# Patient Record
Sex: Female | Born: 1940 | Hispanic: No | Marital: Married
Health system: Southern US, Community
[De-identification: ages and names within clinical notes are randomized; demographics above are authoritative.]

---

## 2012-02-27 DIAGNOSIS — M949 Disorder of cartilage, unspecified: Secondary | ICD-10-CM | POA: Diagnosis not present

## 2012-02-27 DIAGNOSIS — R82998 Other abnormal findings in urine: Secondary | ICD-10-CM | POA: Diagnosis not present

## 2012-02-27 DIAGNOSIS — Z0000 Encounter for general adult medical examination without abnormal findings: Secondary | ICD-10-CM | POA: Diagnosis not present

## 2012-03-04 DIAGNOSIS — M899 Disorder of bone, unspecified: Secondary | ICD-10-CM | POA: Diagnosis not present

## 2012-03-04 DIAGNOSIS — Z23 Encounter for immunization: Secondary | ICD-10-CM | POA: Diagnosis not present

## 2012-03-04 DIAGNOSIS — Z0000 Encounter for general adult medical examination without abnormal findings: Secondary | ICD-10-CM | POA: Diagnosis not present

## 2012-03-04 DIAGNOSIS — Z1212 Encounter for screening for malignant neoplasm of rectum: Secondary | ICD-10-CM | POA: Diagnosis not present

## 2012-03-04 DIAGNOSIS — M949 Disorder of cartilage, unspecified: Secondary | ICD-10-CM | POA: Diagnosis not present

## 2012-03-04 DIAGNOSIS — M1990 Unspecified osteoarthritis, unspecified site: Secondary | ICD-10-CM | POA: Diagnosis not present

## 2012-05-29 DIAGNOSIS — M949 Disorder of cartilage, unspecified: Secondary | ICD-10-CM | POA: Diagnosis not present

## 2012-05-29 DIAGNOSIS — M899 Disorder of bone, unspecified: Secondary | ICD-10-CM | POA: Diagnosis not present

## 2012-06-13 DIAGNOSIS — H353190 Nonexudative age-related macular degeneration, unspecified eye, stage unspecified: Secondary | ICD-10-CM | POA: Diagnosis not present

## 2012-06-13 DIAGNOSIS — H2510 Age-related nuclear cataract, unspecified eye: Secondary | ICD-10-CM | POA: Diagnosis not present

## 2012-11-25 DIAGNOSIS — H2510 Age-related nuclear cataract, unspecified eye: Secondary | ICD-10-CM | POA: Diagnosis not present

## 2012-11-25 DIAGNOSIS — H353190 Nonexudative age-related macular degeneration, unspecified eye, stage unspecified: Secondary | ICD-10-CM | POA: Diagnosis not present

## 2012-11-25 DIAGNOSIS — H43819 Vitreous degeneration, unspecified eye: Secondary | ICD-10-CM | POA: Diagnosis not present

## 2013-02-21 DIAGNOSIS — J029 Acute pharyngitis, unspecified: Secondary | ICD-10-CM | POA: Diagnosis not present

## 2013-02-21 DIAGNOSIS — IMO0002 Reserved for concepts with insufficient information to code with codable children: Secondary | ICD-10-CM | POA: Diagnosis not present

## 2013-02-21 DIAGNOSIS — R05 Cough: Secondary | ICD-10-CM | POA: Diagnosis not present

## 2013-02-21 DIAGNOSIS — R0982 Postnasal drip: Secondary | ICD-10-CM | POA: Diagnosis not present

## 2013-03-03 DIAGNOSIS — R82998 Other abnormal findings in urine: Secondary | ICD-10-CM | POA: Diagnosis not present

## 2013-03-03 DIAGNOSIS — M899 Disorder of bone, unspecified: Secondary | ICD-10-CM | POA: Diagnosis not present

## 2013-03-03 DIAGNOSIS — Z0000 Encounter for general adult medical examination without abnormal findings: Secondary | ICD-10-CM | POA: Diagnosis not present

## 2013-03-03 DIAGNOSIS — R809 Proteinuria, unspecified: Secondary | ICD-10-CM | POA: Diagnosis not present

## 2013-03-10 DIAGNOSIS — H269 Unspecified cataract: Secondary | ICD-10-CM | POA: Diagnosis not present

## 2013-03-10 DIAGNOSIS — T6191XA Toxic effect of unspecified seafood, accidental (unintentional), initial encounter: Secondary | ICD-10-CM | POA: Diagnosis not present

## 2013-03-10 DIAGNOSIS — M899 Disorder of bone, unspecified: Secondary | ICD-10-CM | POA: Diagnosis not present

## 2013-03-10 DIAGNOSIS — Z23 Encounter for immunization: Secondary | ICD-10-CM | POA: Diagnosis not present

## 2013-03-10 DIAGNOSIS — M1990 Unspecified osteoarthritis, unspecified site: Secondary | ICD-10-CM | POA: Diagnosis not present

## 2013-03-10 DIAGNOSIS — IMO0002 Reserved for concepts with insufficient information to code with codable children: Secondary | ICD-10-CM | POA: Diagnosis not present

## 2013-03-10 DIAGNOSIS — Z1331 Encounter for screening for depression: Secondary | ICD-10-CM | POA: Diagnosis not present

## 2013-03-10 DIAGNOSIS — Z0000 Encounter for general adult medical examination without abnormal findings: Secondary | ICD-10-CM | POA: Diagnosis not present

## 2014-02-25 DIAGNOSIS — Z23 Encounter for immunization: Secondary | ICD-10-CM | POA: Diagnosis not present

## 2014-03-06 DIAGNOSIS — Z0000 Encounter for general adult medical examination without abnormal findings: Secondary | ICD-10-CM | POA: Diagnosis not present

## 2014-03-06 DIAGNOSIS — R8299 Other abnormal findings in urine: Secondary | ICD-10-CM | POA: Diagnosis not present

## 2014-03-06 DIAGNOSIS — R8290 Unspecified abnormal findings in urine: Secondary | ICD-10-CM | POA: Diagnosis not present

## 2014-03-06 DIAGNOSIS — M8580 Other specified disorders of bone density and structure, unspecified site: Secondary | ICD-10-CM | POA: Diagnosis not present

## 2014-03-12 ENCOUNTER — Other Ambulatory Visit: Payer: Self-pay | Admitting: Internal Medicine

## 2014-03-12 DIAGNOSIS — M8580 Other specified disorders of bone density and structure, unspecified site: Secondary | ICD-10-CM | POA: Diagnosis not present

## 2014-03-12 DIAGNOSIS — Z008 Encounter for other general examination: Secondary | ICD-10-CM | POA: Diagnosis not present

## 2014-03-12 DIAGNOSIS — M1990 Unspecified osteoarthritis, unspecified site: Secondary | ICD-10-CM | POA: Diagnosis not present

## 2014-03-12 DIAGNOSIS — R319 Hematuria, unspecified: Secondary | ICD-10-CM

## 2014-03-12 DIAGNOSIS — Z6822 Body mass index (BMI) 22.0-22.9, adult: Secondary | ICD-10-CM | POA: Diagnosis not present

## 2014-03-12 DIAGNOSIS — Z91013 Allergy to seafood: Secondary | ICD-10-CM | POA: Diagnosis not present

## 2014-03-12 DIAGNOSIS — Z1212 Encounter for screening for malignant neoplasm of rectum: Secondary | ICD-10-CM | POA: Diagnosis not present

## 2014-03-12 DIAGNOSIS — H269 Unspecified cataract: Secondary | ICD-10-CM | POA: Diagnosis not present

## 2014-03-12 DIAGNOSIS — Z1231 Encounter for screening mammogram for malignant neoplasm of breast: Secondary | ICD-10-CM

## 2014-03-12 DIAGNOSIS — Z1389 Encounter for screening for other disorder: Secondary | ICD-10-CM | POA: Diagnosis not present

## 2014-05-12 ENCOUNTER — Ambulatory Visit: Payer: Self-pay

## 2014-07-07 DIAGNOSIS — M859 Disorder of bone density and structure, unspecified: Secondary | ICD-10-CM | POA: Diagnosis not present

## 2015-01-20 DIAGNOSIS — M5432 Sciatica, left side: Secondary | ICD-10-CM | POA: Diagnosis not present

## 2015-01-20 DIAGNOSIS — S335XXA Sprain of ligaments of lumbar spine, initial encounter: Secondary | ICD-10-CM | POA: Diagnosis not present

## 2015-01-25 DIAGNOSIS — S335XXD Sprain of ligaments of lumbar spine, subsequent encounter: Secondary | ICD-10-CM | POA: Diagnosis not present

## 2015-01-29 DIAGNOSIS — S335XXD Sprain of ligaments of lumbar spine, subsequent encounter: Secondary | ICD-10-CM | POA: Diagnosis not present

## 2015-02-02 DIAGNOSIS — S335XXD Sprain of ligaments of lumbar spine, subsequent encounter: Secondary | ICD-10-CM | POA: Diagnosis not present

## 2015-02-05 DIAGNOSIS — S335XXD Sprain of ligaments of lumbar spine, subsequent encounter: Secondary | ICD-10-CM | POA: Diagnosis not present

## 2015-02-09 DIAGNOSIS — S335XXD Sprain of ligaments of lumbar spine, subsequent encounter: Secondary | ICD-10-CM | POA: Diagnosis not present

## 2015-02-12 DIAGNOSIS — S335XXD Sprain of ligaments of lumbar spine, subsequent encounter: Secondary | ICD-10-CM | POA: Diagnosis not present

## 2015-02-17 DIAGNOSIS — S335XXD Sprain of ligaments of lumbar spine, subsequent encounter: Secondary | ICD-10-CM | POA: Diagnosis not present

## 2015-02-19 DIAGNOSIS — S335XXA Sprain of ligaments of lumbar spine, initial encounter: Secondary | ICD-10-CM | POA: Diagnosis not present

## 2015-02-19 DIAGNOSIS — M5432 Sciatica, left side: Secondary | ICD-10-CM | POA: Diagnosis not present

## 2015-03-12 DIAGNOSIS — M859 Disorder of bone density and structure, unspecified: Secondary | ICD-10-CM | POA: Diagnosis not present

## 2015-03-12 DIAGNOSIS — N390 Urinary tract infection, site not specified: Secondary | ICD-10-CM | POA: Diagnosis not present

## 2015-03-12 DIAGNOSIS — Z0000 Encounter for general adult medical examination without abnormal findings: Secondary | ICD-10-CM | POA: Diagnosis not present

## 2015-03-12 DIAGNOSIS — R8290 Unspecified abnormal findings in urine: Secondary | ICD-10-CM | POA: Diagnosis not present

## 2015-03-13 DIAGNOSIS — Z23 Encounter for immunization: Secondary | ICD-10-CM | POA: Diagnosis not present

## 2015-03-19 DIAGNOSIS — M419 Scoliosis, unspecified: Secondary | ICD-10-CM | POA: Diagnosis not present

## 2015-03-19 DIAGNOSIS — M1990 Unspecified osteoarthritis, unspecified site: Secondary | ICD-10-CM | POA: Diagnosis not present

## 2015-03-19 DIAGNOSIS — Z1389 Encounter for screening for other disorder: Secondary | ICD-10-CM | POA: Diagnosis not present

## 2015-03-19 DIAGNOSIS — M8580 Other specified disorders of bone density and structure, unspecified site: Secondary | ICD-10-CM | POA: Diagnosis not present

## 2015-03-19 DIAGNOSIS — Z91013 Allergy to seafood: Secondary | ICD-10-CM | POA: Diagnosis not present

## 2015-03-19 DIAGNOSIS — M545 Low back pain: Secondary | ICD-10-CM | POA: Diagnosis not present

## 2015-03-19 DIAGNOSIS — M5416 Radiculopathy, lumbar region: Secondary | ICD-10-CM | POA: Diagnosis not present

## 2015-03-19 DIAGNOSIS — R319 Hematuria, unspecified: Secondary | ICD-10-CM | POA: Diagnosis not present

## 2015-03-19 DIAGNOSIS — Z0000 Encounter for general adult medical examination without abnormal findings: Secondary | ICD-10-CM | POA: Diagnosis not present

## 2015-03-19 DIAGNOSIS — H269 Unspecified cataract: Secondary | ICD-10-CM | POA: Diagnosis not present

## 2015-03-24 ENCOUNTER — Other Ambulatory Visit: Payer: Self-pay | Admitting: Internal Medicine

## 2015-03-24 DIAGNOSIS — R319 Hematuria, unspecified: Secondary | ICD-10-CM

## 2015-03-25 ENCOUNTER — Other Ambulatory Visit: Payer: Self-pay

## 2015-03-25 DIAGNOSIS — Z1212 Encounter for screening for malignant neoplasm of rectum: Secondary | ICD-10-CM | POA: Diagnosis not present

## 2015-03-25 DIAGNOSIS — Z1231 Encounter for screening mammogram for malignant neoplasm of breast: Secondary | ICD-10-CM

## 2015-03-26 ENCOUNTER — Other Ambulatory Visit: Payer: Self-pay

## 2015-03-27 ENCOUNTER — Encounter (HOSPITAL_COMMUNITY): Payer: Self-pay | Admitting: Emergency Medicine

## 2015-03-27 ENCOUNTER — Emergency Department (HOSPITAL_COMMUNITY): Payer: Medicare Other

## 2015-03-27 ENCOUNTER — Emergency Department (HOSPITAL_COMMUNITY)
Admission: EM | Admit: 2015-03-27 | Discharge: 2015-03-27 | Disposition: A | Discharge status: Home / Self Care | Payer: Medicare Other | Attending: Emergency Medicine | Admitting: Emergency Medicine

## 2015-03-27 DIAGNOSIS — S0990XA Unspecified injury of head, initial encounter: Secondary | ICD-10-CM | POA: Diagnosis present

## 2015-03-27 DIAGNOSIS — S80212A Abrasion, left knee, initial encounter: Secondary | ICD-10-CM | POA: Insufficient documentation

## 2015-03-27 DIAGNOSIS — W01198A Fall on same level from slipping, tripping and stumbling with subsequent striking against other object, initial encounter: Secondary | ICD-10-CM | POA: Diagnosis not present

## 2015-03-27 DIAGNOSIS — S60511A Abrasion of right hand, initial encounter: Secondary | ICD-10-CM | POA: Insufficient documentation

## 2015-03-27 DIAGNOSIS — Y998 Other external cause status: Secondary | ICD-10-CM | POA: Diagnosis not present

## 2015-03-27 DIAGNOSIS — S0003XA Contusion of scalp, initial encounter: Secondary | ICD-10-CM | POA: Diagnosis not present

## 2015-03-27 DIAGNOSIS — Y92093 Driveway of other non-institutional residence as the place of occurrence of the external cause: Secondary | ICD-10-CM | POA: Diagnosis not present

## 2015-03-27 DIAGNOSIS — S0181XA Laceration without foreign body of other part of head, initial encounter: Secondary | ICD-10-CM | POA: Insufficient documentation

## 2015-03-27 DIAGNOSIS — Y9301 Activity, walking, marching and hiking: Secondary | ICD-10-CM | POA: Insufficient documentation

## 2015-03-27 DIAGNOSIS — S80211A Abrasion, right knee, initial encounter: Secondary | ICD-10-CM | POA: Insufficient documentation

## 2015-03-27 DIAGNOSIS — W19XXXA Unspecified fall, initial encounter: Secondary | ICD-10-CM

## 2015-03-27 DIAGNOSIS — M25562 Pain in left knee: Secondary | ICD-10-CM | POA: Diagnosis not present

## 2015-03-27 DIAGNOSIS — M25462 Effusion, left knee: Secondary | ICD-10-CM | POA: Diagnosis not present

## 2015-03-27 DIAGNOSIS — S0191XA Laceration without foreign body of unspecified part of head, initial encounter: Secondary | ICD-10-CM

## 2015-03-27 MED ORDER — IBUPROFEN 800 MG PO TABS: 800.0000 mg | ORAL_TABLET | Freq: Three times a day (TID) | ORAL | Status: AC

## 2015-03-27 MED ORDER — IBUPROFEN 800 MG PO TABS
800.0000 mg | ORAL_TABLET | Freq: Once | ORAL | Status: AC
  Administered 2015-03-27: 800 mg via ORAL
  Filled 2015-03-27: qty 1

## 2015-03-27 NOTE — ED Provider Notes (Signed)
CSN: [id number]     Arrival date & time 03/27/15  1350 History   First MD Initiated Contact with Patient 03/27/15 1504     Chief Complaint  Patient presents with  . Head Injury  . Fall  . Knee Injury     (Consider location/radiation/quality/duration/timing/severity/associated sxs/prior Treatment) HPI   Karla West is a 74 y.o. female, patient with no pertinent past medical history, presenting to the ED with all. Patient states that she was walking on the concrete driveway, tripped over the dog, hit her head and landed on her hands and knees. Patient complains of some soreness to her forehead around a small laceration as well as some throbbing, 4 out of 5, nonradiating pain to her left knee. Patient denies LOC, neurologic deficits, confusion, nausea or vomiting, headache, visual disturbance, or any other complaints. Patient also denies neck or back pain and denies anticoagulation. Patient's daughter was immediately at the patient's side and verified the patient's story.    History reviewed. No pertinent past medical history. History reviewed. No pertinent past surgical history. History reviewed. No pertinent family history. Social History  Substance Use Topics  . Smoking status: None  . Smokeless tobacco: None  . Alcohol Use: None   OB History    No data available     Review of Systems  Musculoskeletal: Negative for back pain and neck pain.       Left knee pain, pain from multiple abrasions on patient's forehead, nose, and right and left knees.  Neurological: Negative for dizziness, syncope, weakness, numbness and headaches.  All other systems reviewed and are negative.     Allergies  Shellfish-derived products  Home Medications   Prior to Admission medications   Medication Sig Start Date End Date Taking? Authorizing Provider  ibuprofen (ADVIL,MOTRIN) 800 MG tablet Take 1 tablet (800 mg total) by mouth 3 (three) times daily. 03/27/15   Constantino Starace C Sharol Croghan, PA-C   BP 122/84  mmHg  Pulse 84  Temp(Src) 97.9 F (36.6 C) (Oral)  Resp 18  SpO2 98% Physical Exam  Constitutional: She is oriented to person, place, and time. She appears well-developed and well-nourished. No distress.  HENT:  Head: Normocephalic and atraumatic.  Right Ear: External ear normal.  Left Ear: External ear normal.  Mouth/Throat: Oropharynx is clear and moist.  A 1 cm laceration present on patient's forehead. Wound edges are well approximated. Patient has surrounding abrasions on her forehead as well as on the bridge of her nose. No raccoon's eyes, maxillary bruising, or any signs of basilar skull fracture. All structures to patient's head and face are stable. No epistaxis and ear canals are clear from any bleeding.  Eyes: Conjunctivae and EOM are normal. Pupils are equal, round, and reactive to light.  Neck: Normal range of motion. Neck supple.  Cardiovascular: Normal rate, regular rhythm and normal heart sounds.   Pulmonary/Chest: Effort normal and breath sounds normal. No respiratory distress.  Abdominal: Soft. Bowel sounds are normal.  Musculoskeletal: She exhibits no edema or tenderness.  Full range of motion without pain in all 4 extremities as well as patient's spine. No paraspinal tenderness. No laxity in the left knee.   Neurological: She is alert and oriented to person, place, and time. She has normal reflexes.  No sensory deficits. Strength 5/5 in all extremities. No gait disturbance. Cranial nerves II-XII grossly intact.  Skin: Skin is warm and dry. She is not diaphoretic.  Minor abrasions noted to patient's knees and right palm.  Nursing  note and vitals reviewed.   ED Course  Procedures (including critical care time) Labs Review Labs Reviewed - No data to display  Imaging Review Ct Head Wo Contrast  03/27/2015  CLINICAL DATA:  Head trauma secondary to a fall in his driveway. Laceration to the forehead. EXAM: CT HEAD WITHOUT CONTRAST TECHNIQUE: Contiguous axial images were  obtained from the base of the skull through the vertex without intravenous contrast. COMPARISON:  None. FINDINGS: No mass lesion. No midline shift. No acute hemorrhage or hematoma. No extra-axial fluid collections. No evidence of acute infarction. Brain parenchyma is normal. No osseous abnormality. Right frontal scalp contusion. IMPRESSION: Right frontal scalp contusion.  Otherwise, normal exam. Electronically Signed   By: Francene BoyersJames  Maxwell M.D.   On: 03/27/2015 16:20   Dg Knee Complete 4 Views Left  03/27/2015  CLINICAL DATA:  Initial encounter for Dog tripped patient on concrete driveway. Injured left knee with superficial abrasion, swelling, bruising. Denies previous history of left knee injury. EXAM: LEFT KNEE - COMPLETE 4+ VIEW COMPARISON:  None. FINDINGS: No acute fracture or dislocation. Small suprapatellar joint effusion. Mild medial and patellofemoral compartment joint space narrowing and subchondral sclerosis. IMPRESSION: Small suprapatellar joint effusion.  Otherwise, no acute finding. Electronically Signed   By: Jeronimo GreavesKyle  Talbot M.D.   On: 03/27/2015 14:49   I have personally reviewed and evaluated these images and lab results as part of my medical decision-making.   EKG Interpretation None      MDM   Final diagnoses:  Fall, initial encounter  Laceration of head, initial encounter  Left knee pain    Karla West presents with injuries from a fall including a forehead laceration and left knee pain.  Findings and plan of care discussed with Jerelyn ScottMartha Linker, MD.  Patient has no focal neurologic deficits, no loss of consciousness, and no signs of severe head injury. The only criteria the patient fits for head CT is her age. Plan to CT patient's head, apply knee immobilizer, give patient a prescription for anti-inflammatories, and instruct patient follow up with her orthopedist. Patient is already established patient [facility] and has an appointment coming up on December 2,  but patient was instructed to call their office and ask if they want to see her sooner. Plan of care was communicated with the patient, who agreed to the plan. Pt daughter at the bedside agreed to watch over patient for the next 24 hours to look for signs of intracranial hemorrhage or other severe head injury. Pt and pt daughter were given a list of signs and symptoms to watch out for. CT of the head was free from evidence of fracture or intracranial hemorrhage.    Anselm PancoastShawn C Kaius Daino, PA-C 03/27/15 1637  Jerelyn ScottMartha Linker, MD 03/27/15 1640  Jerelyn ScottMartha Linker, MD 04/02/15 [phone]

## 2015-03-27 NOTE — ED Notes (Signed)
Dog tripped patient on concrete driveway. Hit head, has laceration to forehead. Injured left knee with superficial abrasion, swelling, bruising. Denies being on blood thinners or neck pain.

## 2015-03-27 NOTE — Discharge Instructions (Signed)
You have been seen today for injuries from a fall. Your head CT showed no abnormalities. It is recommended that you follow-up [facility] on Monday. Follow up with PCP as needed. Return to ED should symptoms worsen. May use ibuprofen, Aleve, Motrin, or Advil for pain and inflammation. Refer to the attached information sheets for signs and symptoms to watch out for to return to the ED.   Emergency Department Resource Guide 1) Find a Doctor and Pay Out of Pocket Although you won't have to find out who is covered by your insurance plan, it is a good idea to ask around and get recommendations. You will then need to call the office and see if the doctor you have chosen will accept you as a new patient and what types of options they offer for patients who are self-pay. Some doctors offer discounts or will set up payment plans for their patients who do not have insurance, but you will need to ask so you aren't surprised when you get to your appointment.  2) Contact Your Local Health Department Not all health departments have doctors that can see patients for sick visits, but many do, so it is worth a call to see if yours does. If you don't know where your local health department is, you can check in your phone book. The CDC also has a tool to help you locate your state's health department, and many state websites also have listings of all of their local health departments.  3) Find a Walk-in Clinic If your illness is not likely to be very severe or complicated, you may want to try a walk in clinic. These are popping up all over the country in pharmacies, drugstores, and shopping centers. They're usually staffed by nurse practitioners or physician assistants that have been trained to treat common illnesses and complaints. They're usually fairly quick and inexpensive. However, if you have serious medical issues or chronic medical problems, these are probably not your best option.  No Primary Care  Doctor: - Call Health Connect at  [phone] - they can help you locate a primary care doctor that  accepts your insurance, provides certain services, etc. - Physician Referral Service- [phone]  Chronic Pain Problems: Organization         Address  Phone   Notes  [facility]  [phone] Patients need to be referred by their primary care doctor.   Medication Assistance: Organization         Address  Phone   Notes  [facility] [address] [place], [place] [address] [phone] --Must be a resident of [facility] [place] -- Must have NO insurance coverage whatsoever (no Medicaid/ Medicare, etc.) -- The pt. MUST have a primary care doctor that directs their care regularly and follows them in the community   MedAssist  [phone]   [employer]  [phone]    Agencies that provide inexpensive medical care: Organization         Address  Phone   Notes  [facility]  [phone]   [facility]    [phone]   [facility] [address] [place], [place] [address] [phone]   [facility] [address], [place] [phone]   [facility]anned [facility]    [phone]   [facility]    [phone]   [facility]  [address], [place] Phone:  [phone], Fax:  [phone] Hours of Operation:  9 am - 6 pm, M-F.  Also accepts Medicaid/Medicare and self-pay.  [facility] for [place] [address], [address], [place] Phone: [phone], Fax: [phone]. Hours of Operation:  8:30 am - 5:30 pm, M-F.  Also accepts Medicaid and self-pay.  [facility] [address], [place] Phone: [phone]   [place], [place], [place] [phone], Ext. [phone] Mondays & Thursdays: 7-9 AM.  First 15 patients are seen on a first  come, first serve basis.    [place] Providers:  Organization         Address  Phone   Notes  [facility] [address], [place] [phone] Also accepts self-pay patients.  [facility] [address], [place]  [phone]   [place], [address], [place] [phone]   [facility] [address], [place] [phone]   Lucianne Lei [address], [place]   [phone] Only accepts [place] Access [place] patients after they have their name applied to their card.   Self-Pay (no insurance) in [facility]:  Organization         Address  Phone   Notes  Sickle Cell Patients, [facility] [place] [phone]   [facility] [place] [phone]   [facility] [place]  [place], [place],  [phone]   [facility]/Dr. Osei-Bonsu  [address] [place] or [address], [place] [phone] Phone number for both [place] and [place] locations is the same.  Urgent Medical and [facility] [address], [place] [phone]   [facility] [address] [place] or [address] [phone]   [facility] [address] [place] [phone], phone; [phone], fax Sees patients 1st and 3rd Saturday of every month.  Must not qualify for public or private insurance (i.e. Medicaid, Medicare, [place] Health Choice, Veterans' Benefits)  Household income should be no more than 200% of the poverty level The clinic cannot treat you if you are pregnant or think you are pregnant  Sexually transmitted diseases are not treated at the clinic.    Dental Care: Organization          Address  Phone  Notes  [facility] Department of [facility] [place] [phone] Accepts children up to age 5 who are enrolled in [place] or [place]; pregnant women with a Medicaid card; and children who have applied for Medicaid or [place] Health Choice, but were declined, whose parents can pay a reduced fee at time of service.  [facility] Department of [facility]  [address], [place] [phone] Accepts children up to age 14 who are enrolled in [place] or [place]; pregnant women with a Medicaid card; and children who have applied for Medicaid or  Health Choice, but were declined, whose parents can pay a reduced fee at time of service.  [facility] Adult Dental Access PROGRAM  [place], [place] [phone]  Patients are seen by appointment only. Walk-ins are not accepted. [place] will see patients 62 years of age and older. Monday - Tuesday (8am-5pm) Most Wednesdays (8:30-5pm) $30 per visit, cash only  [facility] Adult Dental Access PROGRAM  [address], [facility] [phone] Patients are seen by appointment only. Walk-ins are not accepted. [place] will see patients 76 years of age and older. One Wednesday Evening (Monthly: Volunteer Based).  $30 per visit, cash only  [place]  [phone] for adults; Children under age 48, call Graduate Pediatric Dentistry at [phone]. Children aged 49-14, please call [phone] to request a pediatric application.  Dental services are provided in all areas of dental care including fillings, crowns and bridges, complete and partial dentures, implants, gum treatment, root canals, and extractions. Preventive care is also provided. Treatment is provided to both adults and children. Patients are selected via a lottery and there is often a waiting list.   [facility] [address] [place]  [phone] [email or web address]   [facility] [address] [place], [place] [phone], Ext. [phone] Second and Fourth Thursday of each month, opens at 6:30 AM; Clinic ends at 9 AM.  Patients are seen on a first-come first-served basis, and a limited number are seen during each clinic.   [facility]  [address] Hillard Danker [place], [place] [phone]   Eligibility Requirements You must have lived in [place], [place], or [place] counties for at least the last three months.   You cannot be eligible for state or federal sponsored [employer], including [employer], [place], or [employer].   You generally cannot be eligible for healthcare insurance through your employer.    How to apply: Eligibility screenings are held every Tuesday and Wednesday afternoon from 1:00 pm until 4:00 pm. You do not need an appointment for the interview!  [facility] [address] [place], [place]   [place]  [place] Department  [place]  [phone]    Behavioral Health Resources in the Community: Intensive Outpatient Programs Organization         Address  Phone  Notes  [place]. [address] [place], [place] [phone]   [facility] [address], [place], [place]   ADS: Alcohol & Drug Svcs [address] [place], [place]   [place] [address],  [place], [place] or [phone]   Substance Abuse Resources Organization         Address  Phone  Notes  Alcohol and Drug Services  [phone]   [place]  [phone]   The [place]   Chinita Pester  [phone]   Residential & Outpatient Substance Abuse Program  [phone]   Psychological  Services Organization         Address  Phone  Notes  [facility] [place]  [place]  [phone]   [place] [address], [place] or [phone]    Mobile Crisis Teams Organization         Address  Phone  Notes  [facility]rapeutic Alternatives, Mobile Crisis Care Unit  [phone]   [facility] Psychotherapeutic Services  [address] [place], [place]   [facility] [address], [place]  [place] [phone]    Self-Help/Support Groups Organization         Address  Phone             Notes  [facility]ental [facility] Assoc[facility] of [place] - variety of support groups  [place] Call for more information  Narcotics Anonymous (NA), [facility] [address], [employer] [place]  2 meetings at this location   Special educational needs teacher         Address  Phone  Notes  [facility]AP Residential Treatment [place],    [place]  [phone]   [facility]  [address] [place] [address], [place], [place]   [place], [place] [phone] Admissions: 8am-3pm M-F  Incentives Substance [place] [address],    [place], [place] [phone]   [facility] [address] [place], [place], [place]   [facility] [address]  [place], [place]   [facility] Programs - Intensive Outpatient [address], [place], [place]   [facility]  [place], [place] [phone] or [phone]   [facility] ([facility]) [address], [place], [place] Accepts Medicaid  [facility] [place] [address]  [place] [phone] Substance Abuse/Addiction Treatment   [facility] Organization         Address  Phone  Notes  [facility]  [phone]   Domenic Schwab, PhD [address] [place]lis [place], [place]   [phone] or [phone]   Luis Lopez [place] [place], [place] [phone]   [facility] [address], [place], [place] [phone] Insurance/Medicaid/sponsorship through [facility] and Families [address], [address]                                    [place], [place] [phone] [place] [address], [place] [phone]    Dr. Adele Schilder  [phone]   [facility] Dept. 1) [address], [place] 2) [place] 3)  [place] [address], [place] [phone]  [phone]   [place] [phone] or [phone] (After Hours)

## 2015-04-05 DIAGNOSIS — M545 Low back pain: Secondary | ICD-10-CM | POA: Diagnosis not present

## 2015-04-09 ENCOUNTER — Ambulatory Visit: Payer: Medicare Other

## 2015-04-09 DIAGNOSIS — M6281 Muscle weakness (generalized): Secondary | ICD-10-CM | POA: Diagnosis not present

## 2015-04-09 DIAGNOSIS — M545 Low back pain: Secondary | ICD-10-CM | POA: Diagnosis not present

## 2015-04-09 DIAGNOSIS — M25561 Pain in right knee: Secondary | ICD-10-CM | POA: Diagnosis not present

## 2015-04-09 DIAGNOSIS — M25562 Pain in left knee: Secondary | ICD-10-CM | POA: Diagnosis not present

## 2015-04-14 DIAGNOSIS — M6281 Muscle weakness (generalized): Secondary | ICD-10-CM | POA: Diagnosis not present

## 2015-04-14 DIAGNOSIS — M25561 Pain in right knee: Secondary | ICD-10-CM | POA: Diagnosis not present

## 2015-04-14 DIAGNOSIS — M25562 Pain in left knee: Secondary | ICD-10-CM | POA: Diagnosis not present

## 2015-04-14 DIAGNOSIS — M545 Low back pain: Secondary | ICD-10-CM | POA: Diagnosis not present

## 2015-04-16 DIAGNOSIS — M6281 Muscle weakness (generalized): Secondary | ICD-10-CM | POA: Diagnosis not present

## 2015-04-16 DIAGNOSIS — M545 Low back pain: Secondary | ICD-10-CM | POA: Diagnosis not present

## 2015-04-16 DIAGNOSIS — M25562 Pain in left knee: Secondary | ICD-10-CM | POA: Diagnosis not present

## 2015-04-16 DIAGNOSIS — M25561 Pain in right knee: Secondary | ICD-10-CM | POA: Diagnosis not present

## 2015-04-19 DIAGNOSIS — M6281 Muscle weakness (generalized): Secondary | ICD-10-CM | POA: Diagnosis not present

## 2015-04-19 DIAGNOSIS — M545 Low back pain: Secondary | ICD-10-CM | POA: Diagnosis not present

## 2015-04-19 DIAGNOSIS — M25561 Pain in right knee: Secondary | ICD-10-CM | POA: Diagnosis not present

## 2015-04-19 DIAGNOSIS — M25562 Pain in left knee: Secondary | ICD-10-CM | POA: Diagnosis not present

## 2015-04-21 DIAGNOSIS — M545 Low back pain: Secondary | ICD-10-CM | POA: Diagnosis not present

## 2015-04-21 DIAGNOSIS — M25562 Pain in left knee: Secondary | ICD-10-CM | POA: Diagnosis not present

## 2015-04-21 DIAGNOSIS — M25561 Pain in right knee: Secondary | ICD-10-CM | POA: Diagnosis not present

## 2015-04-21 DIAGNOSIS — M6281 Muscle weakness (generalized): Secondary | ICD-10-CM | POA: Diagnosis not present

## 2015-04-23 DIAGNOSIS — M6281 Muscle weakness (generalized): Secondary | ICD-10-CM | POA: Diagnosis not present

## 2015-04-23 DIAGNOSIS — M25562 Pain in left knee: Secondary | ICD-10-CM | POA: Diagnosis not present

## 2015-04-23 DIAGNOSIS — M25561 Pain in right knee: Secondary | ICD-10-CM | POA: Diagnosis not present

## 2015-04-23 DIAGNOSIS — M545 Low back pain: Secondary | ICD-10-CM | POA: Diagnosis not present

## 2015-04-26 DIAGNOSIS — M25561 Pain in right knee: Secondary | ICD-10-CM | POA: Diagnosis not present

## 2015-04-26 DIAGNOSIS — M25562 Pain in left knee: Secondary | ICD-10-CM | POA: Diagnosis not present

## 2015-04-26 DIAGNOSIS — M545 Low back pain: Secondary | ICD-10-CM | POA: Diagnosis not present

## 2015-04-26 DIAGNOSIS — M6281 Muscle weakness (generalized): Secondary | ICD-10-CM | POA: Diagnosis not present

## 2015-04-28 DIAGNOSIS — M25561 Pain in right knee: Secondary | ICD-10-CM | POA: Diagnosis not present

## 2015-04-28 DIAGNOSIS — M6281 Muscle weakness (generalized): Secondary | ICD-10-CM | POA: Diagnosis not present

## 2015-04-28 DIAGNOSIS — M545 Low back pain: Secondary | ICD-10-CM | POA: Diagnosis not present

## 2015-04-28 DIAGNOSIS — M25562 Pain in left knee: Secondary | ICD-10-CM | POA: Diagnosis not present

## 2015-05-12 DIAGNOSIS — M25562 Pain in left knee: Secondary | ICD-10-CM | POA: Diagnosis not present

## 2015-05-12 DIAGNOSIS — M6281 Muscle weakness (generalized): Secondary | ICD-10-CM | POA: Diagnosis not present

## 2015-05-12 DIAGNOSIS — M545 Low back pain: Secondary | ICD-10-CM | POA: Diagnosis not present

## 2015-05-12 DIAGNOSIS — M25561 Pain in right knee: Secondary | ICD-10-CM | POA: Diagnosis not present

## 2015-05-13 ENCOUNTER — Other Ambulatory Visit: Payer: Self-pay

## 2015-05-19 DIAGNOSIS — M6281 Muscle weakness (generalized): Secondary | ICD-10-CM | POA: Diagnosis not present

## 2015-05-19 DIAGNOSIS — M25561 Pain in right knee: Secondary | ICD-10-CM | POA: Diagnosis not present

## 2015-05-19 DIAGNOSIS — M25562 Pain in left knee: Secondary | ICD-10-CM | POA: Diagnosis not present

## 2015-05-19 DIAGNOSIS — M545 Low back pain: Secondary | ICD-10-CM | POA: Diagnosis not present

## 2015-05-21 DIAGNOSIS — M25561 Pain in right knee: Secondary | ICD-10-CM | POA: Diagnosis not present

## 2015-05-21 DIAGNOSIS — M6281 Muscle weakness (generalized): Secondary | ICD-10-CM | POA: Diagnosis not present

## 2015-05-21 DIAGNOSIS — M545 Low back pain: Secondary | ICD-10-CM | POA: Diagnosis not present

## 2015-05-21 DIAGNOSIS — M25562 Pain in left knee: Secondary | ICD-10-CM | POA: Diagnosis not present

## 2015-05-25 DIAGNOSIS — M545 Low back pain: Secondary | ICD-10-CM | POA: Diagnosis not present

## 2015-05-25 DIAGNOSIS — M6281 Muscle weakness (generalized): Secondary | ICD-10-CM | POA: Diagnosis not present

## 2015-05-25 DIAGNOSIS — M25561 Pain in right knee: Secondary | ICD-10-CM | POA: Diagnosis not present

## 2015-05-25 DIAGNOSIS — M25562 Pain in left knee: Secondary | ICD-10-CM | POA: Diagnosis not present

## 2015-06-01 DIAGNOSIS — M545 Low back pain: Secondary | ICD-10-CM | POA: Diagnosis not present

## 2015-06-01 DIAGNOSIS — M6281 Muscle weakness (generalized): Secondary | ICD-10-CM | POA: Diagnosis not present

## 2015-06-01 DIAGNOSIS — M25561 Pain in right knee: Secondary | ICD-10-CM | POA: Diagnosis not present

## 2015-06-01 DIAGNOSIS — M25562 Pain in left knee: Secondary | ICD-10-CM | POA: Diagnosis not present

## 2015-06-08 DIAGNOSIS — M6281 Muscle weakness (generalized): Secondary | ICD-10-CM | POA: Diagnosis not present

## 2015-06-08 DIAGNOSIS — M25561 Pain in right knee: Secondary | ICD-10-CM | POA: Diagnosis not present

## 2015-06-08 DIAGNOSIS — M545 Low back pain: Secondary | ICD-10-CM | POA: Diagnosis not present

## 2015-06-08 DIAGNOSIS — M25562 Pain in left knee: Secondary | ICD-10-CM | POA: Diagnosis not present

## 2015-06-16 DIAGNOSIS — M25562 Pain in left knee: Secondary | ICD-10-CM | POA: Diagnosis not present

## 2015-06-16 DIAGNOSIS — M25561 Pain in right knee: Secondary | ICD-10-CM | POA: Diagnosis not present

## 2015-06-16 DIAGNOSIS — M6281 Muscle weakness (generalized): Secondary | ICD-10-CM | POA: Diagnosis not present

## 2015-06-16 DIAGNOSIS — M545 Low back pain: Secondary | ICD-10-CM | POA: Diagnosis not present

## 2015-06-22 DIAGNOSIS — M6281 Muscle weakness (generalized): Secondary | ICD-10-CM | POA: Diagnosis not present

## 2015-06-22 DIAGNOSIS — M25561 Pain in right knee: Secondary | ICD-10-CM | POA: Diagnosis not present

## 2015-06-22 DIAGNOSIS — M545 Low back pain: Secondary | ICD-10-CM | POA: Diagnosis not present

## 2015-06-22 DIAGNOSIS — M25562 Pain in left knee: Secondary | ICD-10-CM | POA: Diagnosis not present

## 2015-06-29 DIAGNOSIS — M25561 Pain in right knee: Secondary | ICD-10-CM | POA: Diagnosis not present

## 2015-06-29 DIAGNOSIS — M545 Low back pain: Secondary | ICD-10-CM | POA: Diagnosis not present

## 2015-06-29 DIAGNOSIS — M6281 Muscle weakness (generalized): Secondary | ICD-10-CM | POA: Diagnosis not present

## 2015-06-29 DIAGNOSIS — M25562 Pain in left knee: Secondary | ICD-10-CM | POA: Diagnosis not present

## 2016-03-02 DIAGNOSIS — H524 Presbyopia: Secondary | ICD-10-CM | POA: Diagnosis not present

## 2016-03-02 DIAGNOSIS — H2513 Age-related nuclear cataract, bilateral: Secondary | ICD-10-CM | POA: Diagnosis not present

## 2016-03-02 DIAGNOSIS — H353131 Nonexudative age-related macular degeneration, bilateral, early dry stage: Secondary | ICD-10-CM | POA: Diagnosis not present

## 2016-03-04 DIAGNOSIS — Z23 Encounter for immunization: Secondary | ICD-10-CM | POA: Diagnosis not present

## 2016-03-13 DIAGNOSIS — H2512 Age-related nuclear cataract, left eye: Secondary | ICD-10-CM | POA: Diagnosis not present

## 2016-03-20 DIAGNOSIS — R8290 Unspecified abnormal findings in urine: Secondary | ICD-10-CM | POA: Diagnosis not present

## 2016-03-20 DIAGNOSIS — Z0000 Encounter for general adult medical examination without abnormal findings: Secondary | ICD-10-CM | POA: Diagnosis not present

## 2016-03-20 DIAGNOSIS — M859 Disorder of bone density and structure, unspecified: Secondary | ICD-10-CM | POA: Diagnosis not present

## 2016-03-20 DIAGNOSIS — R7989 Other specified abnormal findings of blood chemistry: Secondary | ICD-10-CM | POA: Diagnosis not present

## 2016-03-27 DIAGNOSIS — Z0000 Encounter for general adult medical examination without abnormal findings: Secondary | ICD-10-CM | POA: Diagnosis not present

## 2016-03-27 DIAGNOSIS — R3129 Other microscopic hematuria: Secondary | ICD-10-CM | POA: Diagnosis not present

## 2016-03-27 DIAGNOSIS — M859 Disorder of bone density and structure, unspecified: Secondary | ICD-10-CM | POA: Diagnosis not present

## 2016-03-27 DIAGNOSIS — Z6820 Body mass index (BMI) 20.0-20.9, adult: Secondary | ICD-10-CM | POA: Diagnosis not present

## 2016-03-27 DIAGNOSIS — H268 Other specified cataract: Secondary | ICD-10-CM | POA: Diagnosis not present

## 2016-03-27 DIAGNOSIS — R002 Palpitations: Secondary | ICD-10-CM | POA: Diagnosis not present

## 2016-03-27 DIAGNOSIS — Z1389 Encounter for screening for other disorder: Secondary | ICD-10-CM | POA: Diagnosis not present

## 2016-03-27 DIAGNOSIS — M1990 Unspecified osteoarthritis, unspecified site: Secondary | ICD-10-CM | POA: Diagnosis not present

## 2016-03-28 DIAGNOSIS — Z1212 Encounter for screening for malignant neoplasm of rectum: Secondary | ICD-10-CM | POA: Diagnosis not present

## 2016-04-10 DIAGNOSIS — H2511 Age-related nuclear cataract, right eye: Secondary | ICD-10-CM | POA: Diagnosis not present

## 2016-04-10 DIAGNOSIS — H25811 Combined forms of age-related cataract, right eye: Secondary | ICD-10-CM | POA: Diagnosis not present

## 2016-04-18 DIAGNOSIS — H2512 Age-related nuclear cataract, left eye: Secondary | ICD-10-CM | POA: Diagnosis not present

## 2016-05-15 DIAGNOSIS — H25812 Combined forms of age-related cataract, left eye: Secondary | ICD-10-CM | POA: Diagnosis not present

## 2016-05-15 DIAGNOSIS — H2512 Age-related nuclear cataract, left eye: Secondary | ICD-10-CM | POA: Diagnosis not present

## 2016-07-25 DIAGNOSIS — M859 Disorder of bone density and structure, unspecified: Secondary | ICD-10-CM | POA: Diagnosis not present

## 2016-10-26 DIAGNOSIS — R002 Palpitations: Secondary | ICD-10-CM | POA: Diagnosis not present

## 2016-10-26 DIAGNOSIS — H8110 Benign paroxysmal vertigo, unspecified ear: Secondary | ICD-10-CM | POA: Diagnosis not present

## 2016-10-26 DIAGNOSIS — Z6821 Body mass index (BMI) 21.0-21.9, adult: Secondary | ICD-10-CM | POA: Diagnosis not present

## 2016-12-27 DIAGNOSIS — H353132 Nonexudative age-related macular degeneration, bilateral, intermediate dry stage: Secondary | ICD-10-CM | POA: Diagnosis not present

## 2016-12-27 DIAGNOSIS — H26493 Other secondary cataract, bilateral: Secondary | ICD-10-CM | POA: Diagnosis not present

## 2017-03-26 DIAGNOSIS — R82998 Other abnormal findings in urine: Secondary | ICD-10-CM | POA: Diagnosis not present

## 2017-03-26 DIAGNOSIS — M859 Disorder of bone density and structure, unspecified: Secondary | ICD-10-CM | POA: Diagnosis not present

## 2017-03-26 DIAGNOSIS — Z79899 Other long term (current) drug therapy: Secondary | ICD-10-CM | POA: Diagnosis not present

## 2017-04-02 DIAGNOSIS — M419 Scoliosis, unspecified: Secondary | ICD-10-CM | POA: Diagnosis not present

## 2017-04-02 DIAGNOSIS — M1990 Unspecified osteoarthritis, unspecified site: Secondary | ICD-10-CM | POA: Diagnosis not present

## 2017-04-02 DIAGNOSIS — Z6822 Body mass index (BMI) 22.0-22.9, adult: Secondary | ICD-10-CM | POA: Diagnosis not present

## 2017-04-02 DIAGNOSIS — R002 Palpitations: Secondary | ICD-10-CM | POA: Diagnosis not present

## 2017-04-02 DIAGNOSIS — Z0000 Encounter for general adult medical examination without abnormal findings: Secondary | ICD-10-CM | POA: Diagnosis not present

## 2017-04-02 DIAGNOSIS — Z1389 Encounter for screening for other disorder: Secondary | ICD-10-CM | POA: Diagnosis not present

## 2017-04-02 DIAGNOSIS — Z23 Encounter for immunization: Secondary | ICD-10-CM | POA: Diagnosis not present

## 2017-04-02 DIAGNOSIS — M810 Age-related osteoporosis without current pathological fracture: Secondary | ICD-10-CM | POA: Diagnosis not present

## 2017-07-09 DIAGNOSIS — R229 Localized swelling, mass and lump, unspecified: Secondary | ICD-10-CM | POA: Diagnosis not present

## 2017-07-09 DIAGNOSIS — R21 Rash and other nonspecific skin eruption: Secondary | ICD-10-CM | POA: Diagnosis not present

## 2017-07-09 DIAGNOSIS — L988 Other specified disorders of the skin and subcutaneous tissue: Secondary | ICD-10-CM | POA: Diagnosis not present

## 2017-07-09 DIAGNOSIS — Z6822 Body mass index (BMI) 22.0-22.9, adult: Secondary | ICD-10-CM | POA: Diagnosis not present

## 2017-07-13 DIAGNOSIS — S70361S Insect bite (nonvenomous), right thigh, sequela: Secondary | ICD-10-CM | POA: Diagnosis not present

## 2017-09-21 IMAGING — CR DG KNEE COMPLETE 4+V*L*
4 series · 4 of 4 positions shown · non-contrast
Comparison: None.

CLINICAL DATA: Initial encounter for Dog tripped patient on
concrete driveway. Injured left knee with superficial abrasion,
swelling, bruising. Denies previous history of left knee injury.

EXAM:
LEFT KNEE - COMPLETE 4+ VIEW

[t knee ap left]
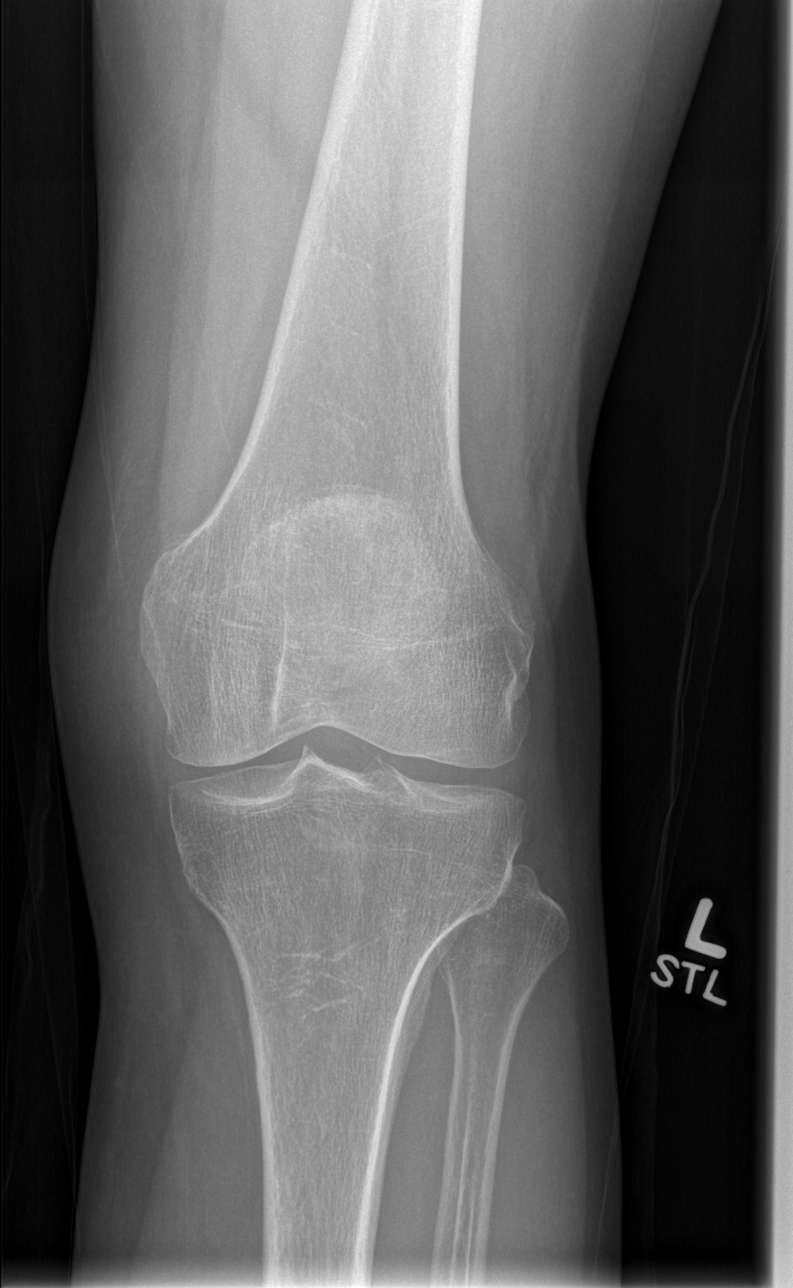

[t knee obl left (1 of 2)]
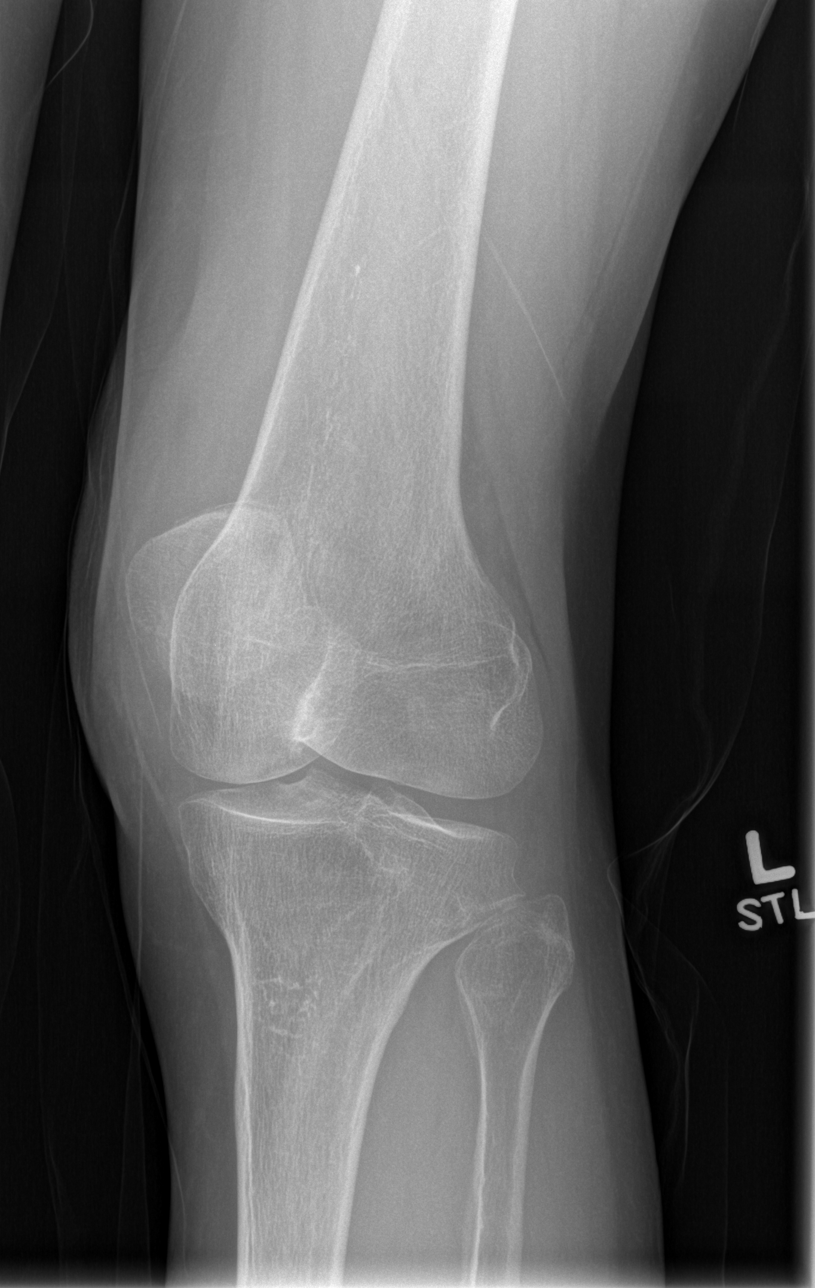

[t knee obl left (2 of 2)]
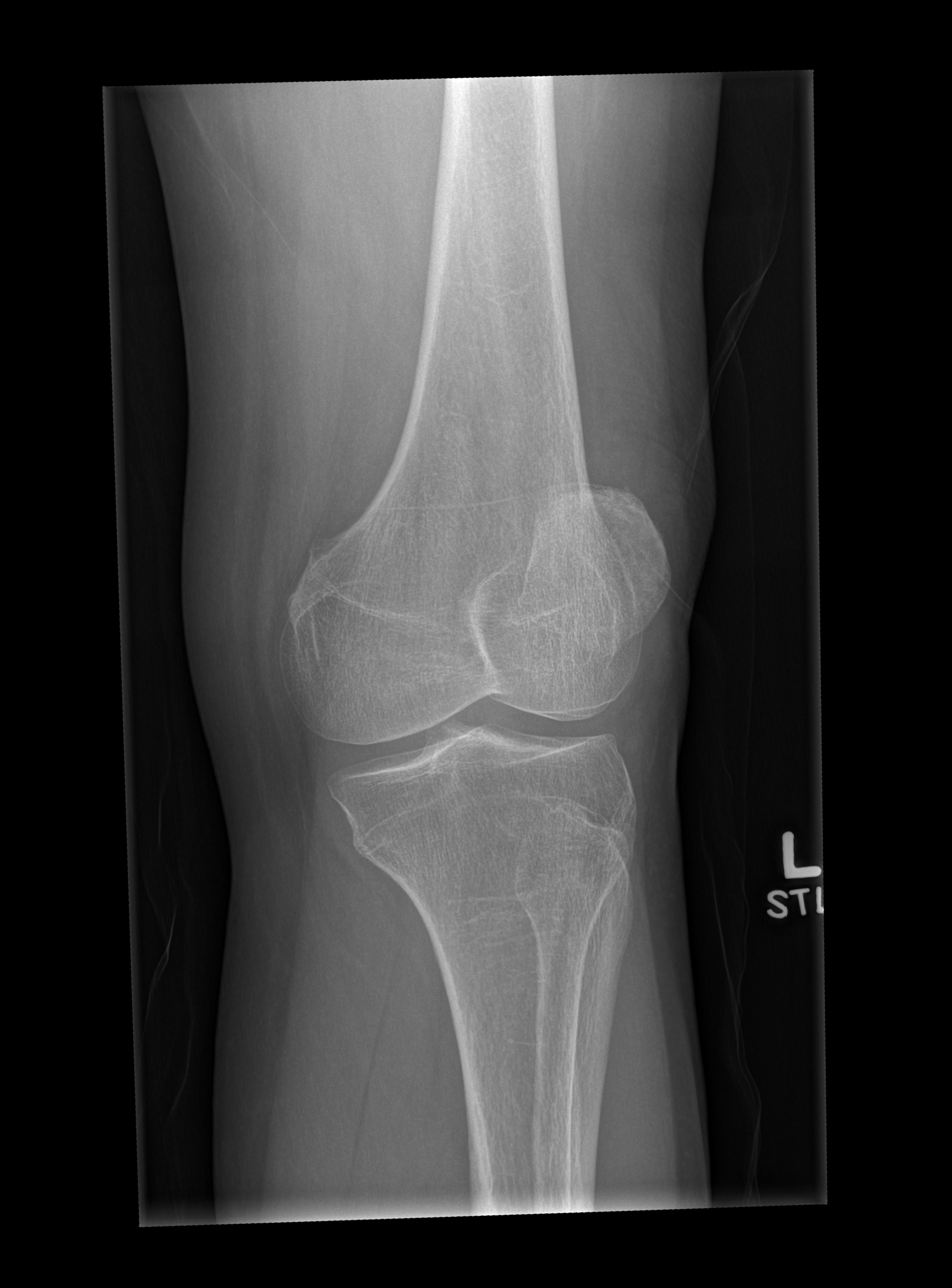

[t knee lat left]
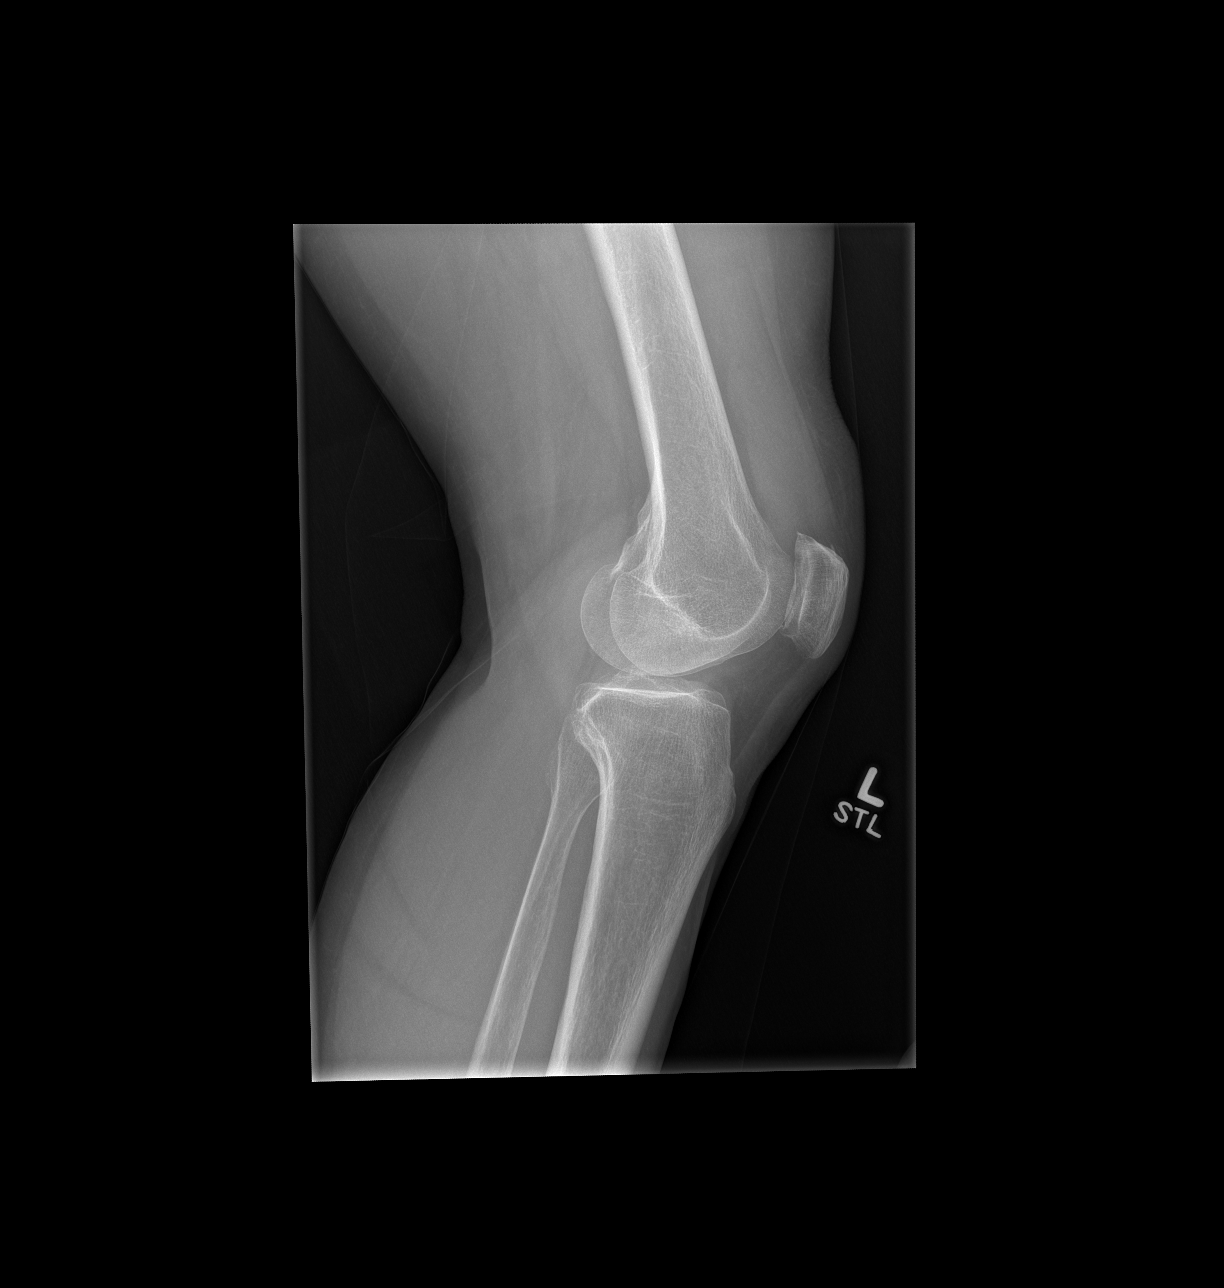

[4 of 4 positions shown; findings below may reference images not displayed]

FINDINGS: No acute fracture or dislocation. Small suprapatellar joint
effusion. Mild medial and patellofemoral compartment joint space
narrowing and subchondral sclerosis.
IMPRESSION: Small suprapatellar joint effusion.  Otherwise, no acute finding.

## 2017-09-21 IMAGING — CT CT HEAD W/O CM
2 series · 17 of 30 positions shown, 20 images · non-contrast
Comparison: None.

CLINICAL DATA: Head trauma secondary to a fall in his driveway.
Laceration to the forehead.

EXAM:
CT HEAD WITHOUT CONTRAST
TECHNIQUE: Contiguous axial images were obtained from the base of the skull
through the vertex without intravenous contrast.

[Series 2: head w/o · axial · non-contrast · 0.45mm/px · z∈[-82,+33]mm · 9 of 29 slices shown, 12 images]
[im 3/29  brain]
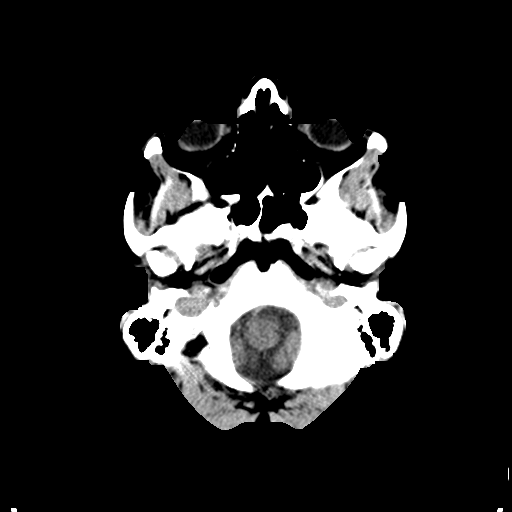
[im 3/29  bone]
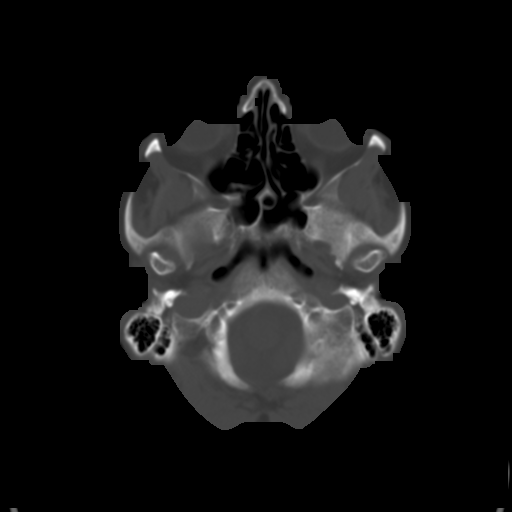
[im 6/29  brain]
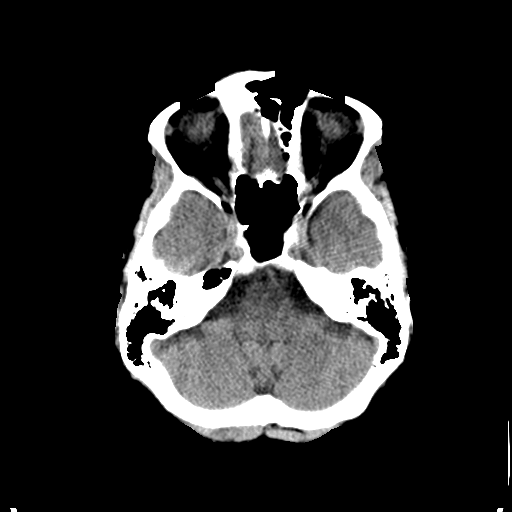
[im 9/29  brain]
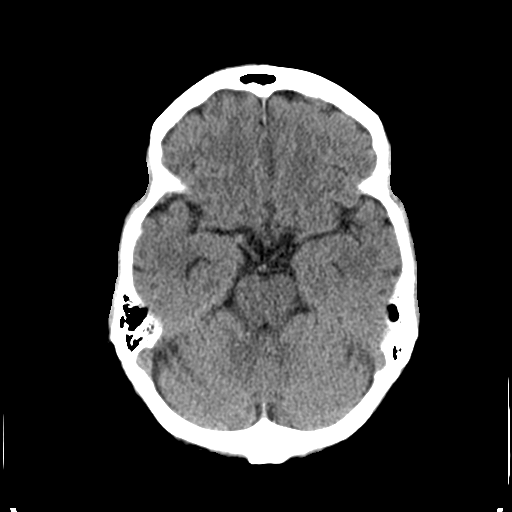
[im 12/29  brain]
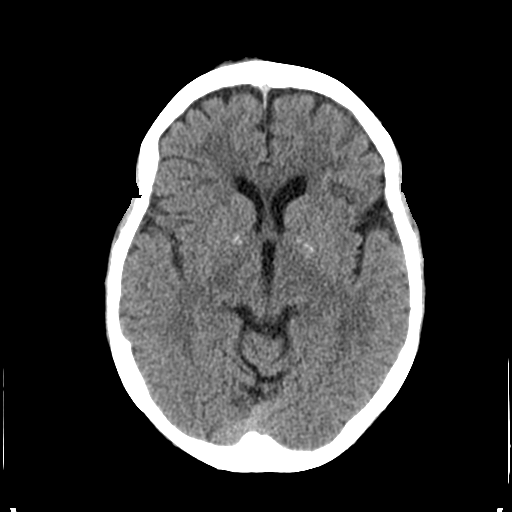
[im 15/29  brain]
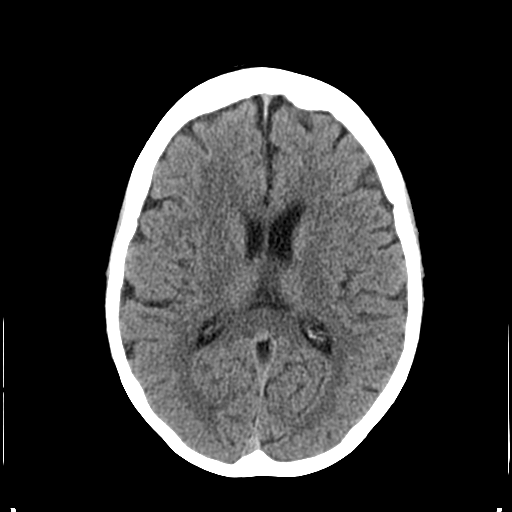
[im 15/29  bone]
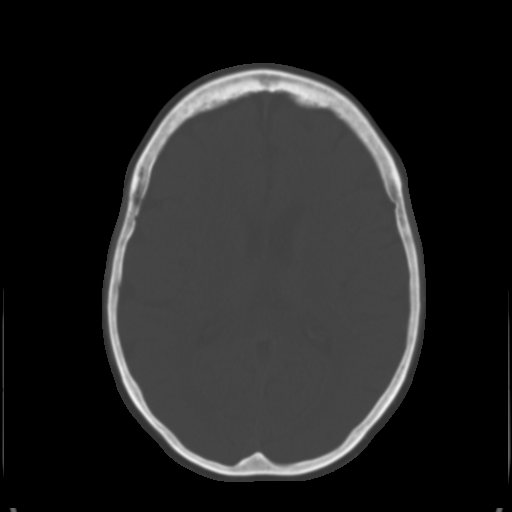
[im 17/29  brain]
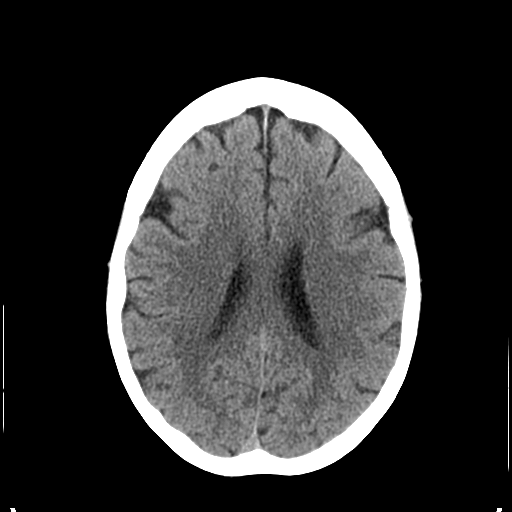
[im 20/29  brain]
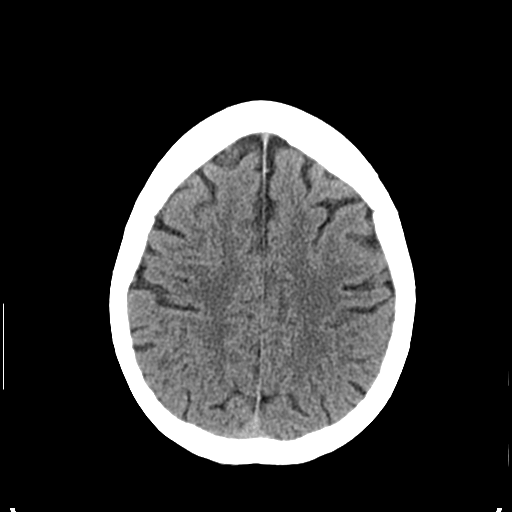
[im 23/29  brain]
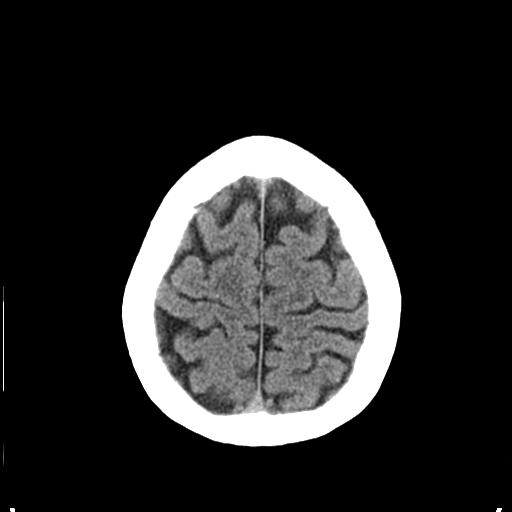
[im 26/29  brain]
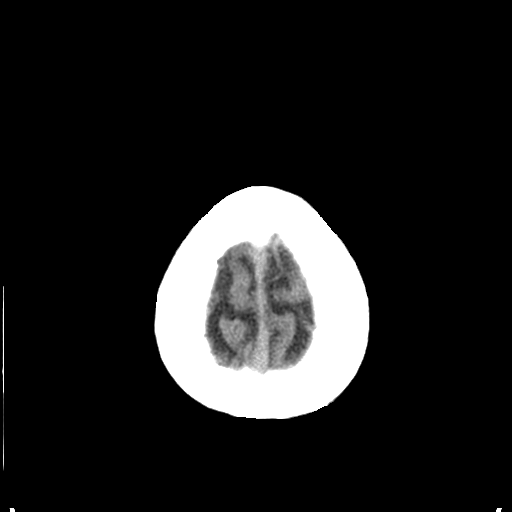
[im 26/29  bone]
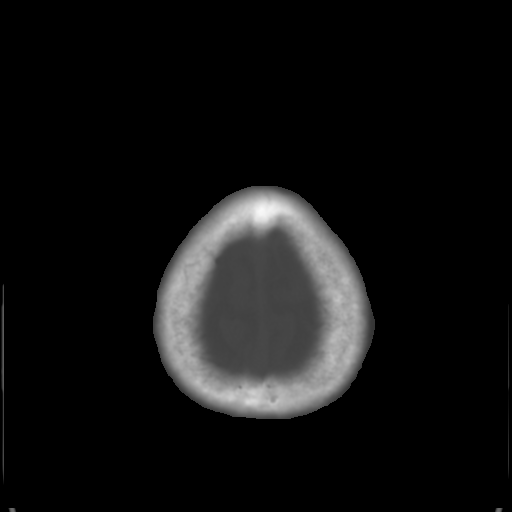

[Series 3: bone windows · axial · 0.45mm/px · z∈[-77,+31]mm · 8 of 48 slices shown]
[im 6/48  bone]
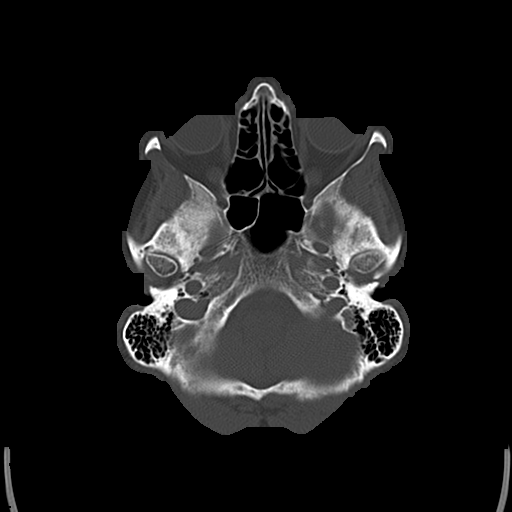
[im 11/48  bone]
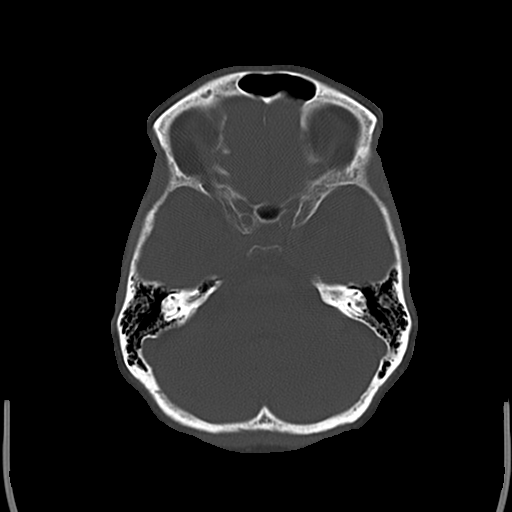
[im 16/48  bone]
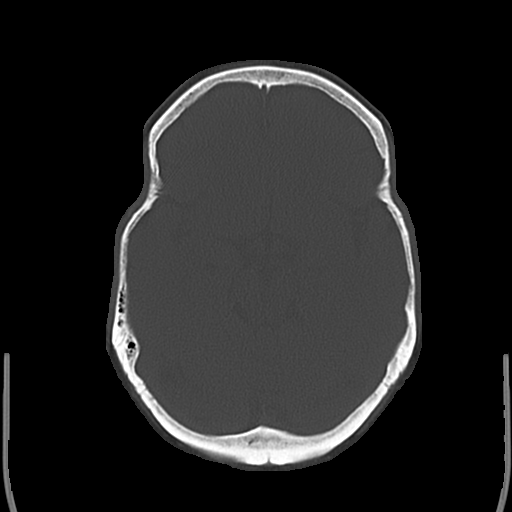
[im 21/48  bone]
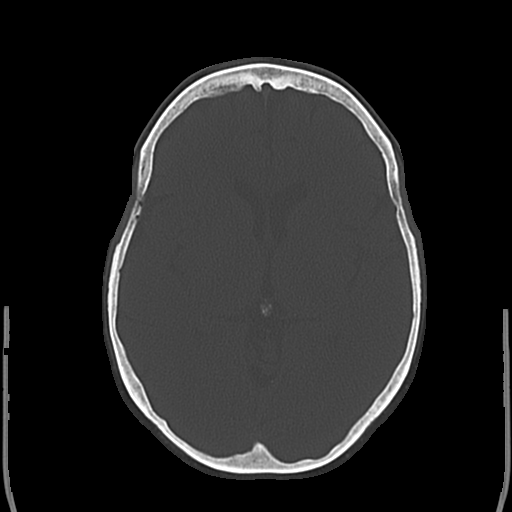
[im 27/48  bone]
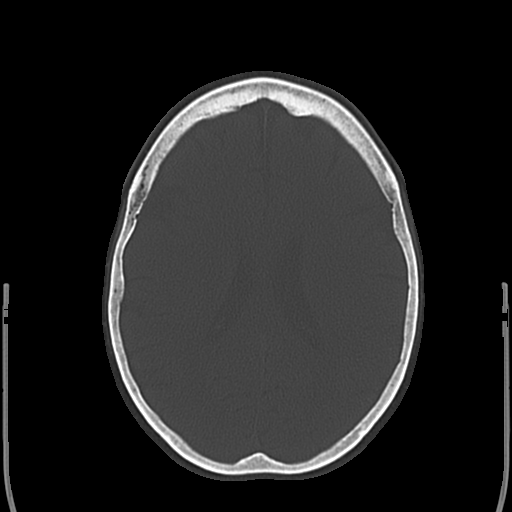
[im 32/48  bone]
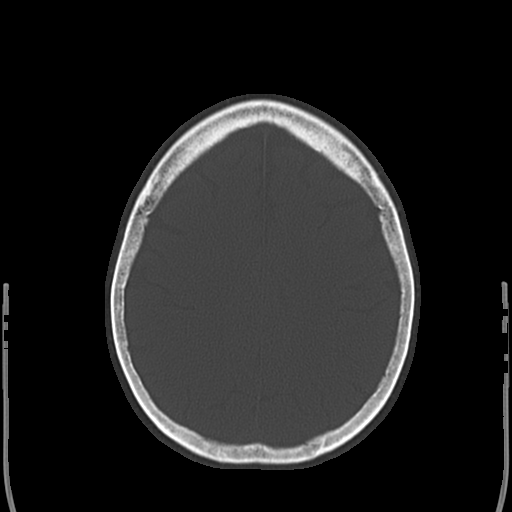
[im 37/48  bone]
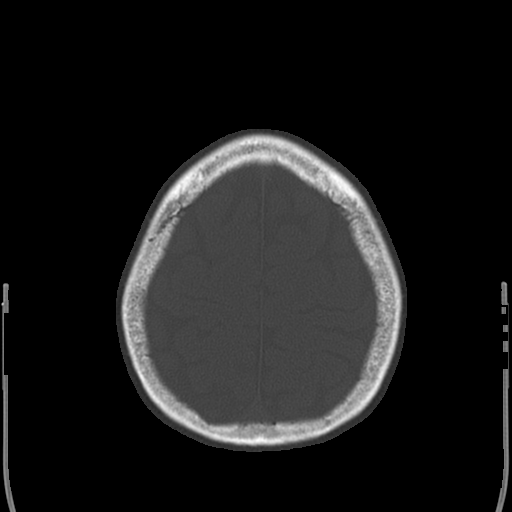
[im 42/48  bone]
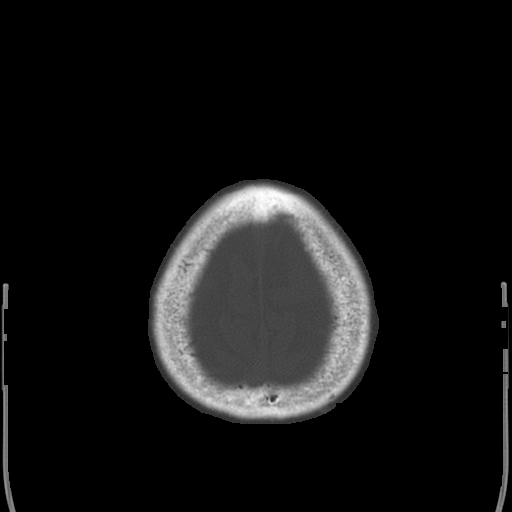

[17 of 30 positions shown; findings below may reference images not displayed]

FINDINGS: No mass lesion. No midline shift. No acute hemorrhage or hematoma.
No extra-axial fluid collections. No evidence of acute infarction.
Brain parenchyma is normal. No osseous abnormality. Right frontal
scalp contusion.
IMPRESSION: Right frontal scalp contusion.  Otherwise, normal exam.

## 2017-12-26 DIAGNOSIS — S93401A Sprain of unspecified ligament of right ankle, initial encounter: Secondary | ICD-10-CM | POA: Diagnosis not present

## 2017-12-26 DIAGNOSIS — Z6823 Body mass index (BMI) 23.0-23.9, adult: Secondary | ICD-10-CM | POA: Diagnosis not present

## 2018-01-01 DIAGNOSIS — S93401A Sprain of unspecified ligament of right ankle, initial encounter: Secondary | ICD-10-CM | POA: Diagnosis not present

## 2018-01-01 DIAGNOSIS — Z6823 Body mass index (BMI) 23.0-23.9, adult: Secondary | ICD-10-CM | POA: Diagnosis not present

## 2018-01-14 DIAGNOSIS — H353132 Nonexudative age-related macular degeneration, bilateral, intermediate dry stage: Secondary | ICD-10-CM | POA: Diagnosis not present

## 2018-01-14 DIAGNOSIS — H26493 Other secondary cataract, bilateral: Secondary | ICD-10-CM | POA: Diagnosis not present

## 2018-01-14 DIAGNOSIS — Z961 Presence of intraocular lens: Secondary | ICD-10-CM | POA: Diagnosis not present

## 2018-01-29 DIAGNOSIS — Z23 Encounter for immunization: Secondary | ICD-10-CM | POA: Diagnosis not present

## 2018-04-01 DIAGNOSIS — R002 Palpitations: Secondary | ICD-10-CM | POA: Diagnosis not present

## 2018-04-01 DIAGNOSIS — R82998 Other abnormal findings in urine: Secondary | ICD-10-CM | POA: Diagnosis not present

## 2018-04-01 DIAGNOSIS — M810 Age-related osteoporosis without current pathological fracture: Secondary | ICD-10-CM | POA: Diagnosis not present

## 2018-04-01 DIAGNOSIS — Z79899 Other long term (current) drug therapy: Secondary | ICD-10-CM | POA: Diagnosis not present

## 2018-04-08 DIAGNOSIS — H8110 Benign paroxysmal vertigo, unspecified ear: Secondary | ICD-10-CM | POA: Diagnosis not present

## 2018-04-08 DIAGNOSIS — R351 Nocturia: Secondary | ICD-10-CM | POA: Diagnosis not present

## 2018-04-08 DIAGNOSIS — M1990 Unspecified osteoarthritis, unspecified site: Secondary | ICD-10-CM | POA: Diagnosis not present

## 2018-04-08 DIAGNOSIS — M810 Age-related osteoporosis without current pathological fracture: Secondary | ICD-10-CM | POA: Diagnosis not present

## 2018-04-08 DIAGNOSIS — Z0000 Encounter for general adult medical examination without abnormal findings: Secondary | ICD-10-CM | POA: Diagnosis not present

## 2018-04-08 DIAGNOSIS — Z1389 Encounter for screening for other disorder: Secondary | ICD-10-CM | POA: Diagnosis not present

## 2018-04-08 DIAGNOSIS — R002 Palpitations: Secondary | ICD-10-CM | POA: Diagnosis not present

## 2018-04-08 DIAGNOSIS — H3530 Unspecified macular degeneration: Secondary | ICD-10-CM | POA: Diagnosis not present

## 2018-04-08 DIAGNOSIS — M419 Scoliosis, unspecified: Secondary | ICD-10-CM | POA: Diagnosis not present

## 2018-04-08 DIAGNOSIS — Z6822 Body mass index (BMI) 22.0-22.9, adult: Secondary | ICD-10-CM | POA: Diagnosis not present

## 2018-04-08 DIAGNOSIS — R0982 Postnasal drip: Secondary | ICD-10-CM | POA: Diagnosis not present

## 2018-04-08 DIAGNOSIS — N898 Other specified noninflammatory disorders of vagina: Secondary | ICD-10-CM | POA: Diagnosis not present

## 2018-04-11 DIAGNOSIS — Z1212 Encounter for screening for malignant neoplasm of rectum: Secondary | ICD-10-CM | POA: Diagnosis not present

## 2018-04-29 DIAGNOSIS — Z124 Encounter for screening for malignant neoplasm of cervix: Secondary | ICD-10-CM | POA: Diagnosis not present

## 2018-04-29 DIAGNOSIS — Z6822 Body mass index (BMI) 22.0-22.9, adult: Secondary | ICD-10-CM | POA: Diagnosis not present

## 2018-04-29 DIAGNOSIS — N762 Acute vulvitis: Secondary | ICD-10-CM | POA: Diagnosis not present

## 2018-04-29 DIAGNOSIS — N952 Postmenopausal atrophic vaginitis: Secondary | ICD-10-CM | POA: Diagnosis not present

## 2018-04-29 DIAGNOSIS — Z1231 Encounter for screening mammogram for malignant neoplasm of breast: Secondary | ICD-10-CM | POA: Diagnosis not present

## 2019-02-27 DIAGNOSIS — Z23 Encounter for immunization: Secondary | ICD-10-CM | POA: Diagnosis not present

## 2019-05-28 DIAGNOSIS — Z961 Presence of intraocular lens: Secondary | ICD-10-CM | POA: Diagnosis not present

## 2019-05-28 DIAGNOSIS — H26493 Other secondary cataract, bilateral: Secondary | ICD-10-CM | POA: Diagnosis not present

## 2019-05-28 DIAGNOSIS — H353132 Nonexudative age-related macular degeneration, bilateral, intermediate dry stage: Secondary | ICD-10-CM | POA: Diagnosis not present

## 2019-05-28 DIAGNOSIS — H524 Presbyopia: Secondary | ICD-10-CM | POA: Diagnosis not present

## 2019-08-20 DIAGNOSIS — Z6823 Body mass index (BMI) 23.0-23.9, adult: Secondary | ICD-10-CM | POA: Diagnosis not present

## 2019-08-20 DIAGNOSIS — Z1231 Encounter for screening mammogram for malignant neoplasm of breast: Secondary | ICD-10-CM | POA: Diagnosis not present

## 2019-08-20 DIAGNOSIS — Z124 Encounter for screening for malignant neoplasm of cervix: Secondary | ICD-10-CM | POA: Diagnosis not present

## 2019-10-07 DIAGNOSIS — Z91013 Allergy to seafood: Secondary | ICD-10-CM | POA: Diagnosis not present

## 2019-10-07 DIAGNOSIS — J3089 Other allergic rhinitis: Secondary | ICD-10-CM | POA: Diagnosis not present

## 2019-10-07 DIAGNOSIS — Z299 Encounter for prophylactic measures, unspecified: Secondary | ICD-10-CM | POA: Diagnosis not present

## 2019-12-03 DIAGNOSIS — H353132 Nonexudative age-related macular degeneration, bilateral, intermediate dry stage: Secondary | ICD-10-CM | POA: Diagnosis not present

## 2019-12-03 DIAGNOSIS — Z961 Presence of intraocular lens: Secondary | ICD-10-CM | POA: Diagnosis not present

## 2019-12-03 DIAGNOSIS — H26493 Other secondary cataract, bilateral: Secondary | ICD-10-CM | POA: Diagnosis not present

## 2019-12-08 DIAGNOSIS — Z23 Encounter for immunization: Secondary | ICD-10-CM | POA: Diagnosis not present

## 2019-12-23 DIAGNOSIS — R05 Cough: Secondary | ICD-10-CM | POA: Diagnosis not present

## 2019-12-23 DIAGNOSIS — J069 Acute upper respiratory infection, unspecified: Secondary | ICD-10-CM | POA: Diagnosis not present

## 2019-12-23 DIAGNOSIS — Z1152 Encounter for screening for COVID-19: Secondary | ICD-10-CM | POA: Diagnosis not present

## 2019-12-31 DIAGNOSIS — M545 Low back pain: Secondary | ICD-10-CM | POA: Diagnosis not present

## 2020-01-07 DIAGNOSIS — H26492 Other secondary cataract, left eye: Secondary | ICD-10-CM | POA: Diagnosis not present

## 2020-01-13 DIAGNOSIS — Z23 Encounter for immunization: Secondary | ICD-10-CM | POA: Diagnosis not present

## 2020-01-20 DIAGNOSIS — M1990 Unspecified osteoarthritis, unspecified site: Secondary | ICD-10-CM | POA: Diagnosis not present

## 2020-01-20 DIAGNOSIS — M419 Scoliosis, unspecified: Secondary | ICD-10-CM | POA: Diagnosis not present

## 2020-01-20 DIAGNOSIS — R0982 Postnasal drip: Secondary | ICD-10-CM | POA: Diagnosis not present

## 2020-01-20 DIAGNOSIS — Z91013 Allergy to seafood: Secondary | ICD-10-CM | POA: Diagnosis not present

## 2020-01-20 DIAGNOSIS — M810 Age-related osteoporosis without current pathological fracture: Secondary | ICD-10-CM | POA: Diagnosis not present

## 2020-01-20 DIAGNOSIS — R351 Nocturia: Secondary | ICD-10-CM | POA: Diagnosis not present

## 2020-01-20 DIAGNOSIS — M8580 Other specified disorders of bone density and structure, unspecified site: Secondary | ICD-10-CM | POA: Diagnosis not present

## 2020-01-20 DIAGNOSIS — M545 Low back pain: Secondary | ICD-10-CM | POA: Diagnosis not present

## 2020-01-20 DIAGNOSIS — H3530 Unspecified macular degeneration: Secondary | ICD-10-CM | POA: Diagnosis not present

## 2020-01-20 DIAGNOSIS — N898 Other specified noninflammatory disorders of vagina: Secondary | ICD-10-CM | POA: Diagnosis not present

## 2020-02-02 DIAGNOSIS — M545 Low back pain: Secondary | ICD-10-CM | POA: Diagnosis not present

## 2020-02-11 DIAGNOSIS — M545 Low back pain, unspecified: Secondary | ICD-10-CM | POA: Diagnosis not present

## 2020-02-18 DIAGNOSIS — M545 Low back pain, unspecified: Secondary | ICD-10-CM | POA: Diagnosis not present

## 2020-02-26 DIAGNOSIS — M545 Low back pain, unspecified: Secondary | ICD-10-CM | POA: Diagnosis not present

## 2020-03-03 DIAGNOSIS — M545 Low back pain, unspecified: Secondary | ICD-10-CM | POA: Diagnosis not present

## 2020-04-19 DIAGNOSIS — D649 Anemia, unspecified: Secondary | ICD-10-CM | POA: Diagnosis not present

## 2020-04-19 DIAGNOSIS — R7989 Other specified abnormal findings of blood chemistry: Secondary | ICD-10-CM | POA: Diagnosis not present

## 2020-04-19 DIAGNOSIS — M859 Disorder of bone density and structure, unspecified: Secondary | ICD-10-CM | POA: Diagnosis not present

## 2020-04-19 DIAGNOSIS — R002 Palpitations: Secondary | ICD-10-CM | POA: Diagnosis not present

## 2020-04-26 DIAGNOSIS — D649 Anemia, unspecified: Secondary | ICD-10-CM | POA: Diagnosis not present

## 2020-04-26 DIAGNOSIS — Z91013 Allergy to seafood: Secondary | ICD-10-CM | POA: Diagnosis not present

## 2020-04-26 DIAGNOSIS — M8580 Other specified disorders of bone density and structure, unspecified site: Secondary | ICD-10-CM | POA: Diagnosis not present

## 2020-04-26 DIAGNOSIS — R002 Palpitations: Secondary | ICD-10-CM | POA: Diagnosis not present

## 2020-04-26 DIAGNOSIS — M419 Scoliosis, unspecified: Secondary | ICD-10-CM | POA: Diagnosis not present

## 2020-04-26 DIAGNOSIS — Z1212 Encounter for screening for malignant neoplasm of rectum: Secondary | ICD-10-CM | POA: Diagnosis not present

## 2020-04-26 DIAGNOSIS — Z0000 Encounter for general adult medical examination without abnormal findings: Secondary | ICD-10-CM | POA: Diagnosis not present

## 2020-04-26 DIAGNOSIS — R351 Nocturia: Secondary | ICD-10-CM | POA: Diagnosis not present

## 2020-04-26 DIAGNOSIS — R82998 Other abnormal findings in urine: Secondary | ICD-10-CM | POA: Diagnosis not present

## 2020-04-26 DIAGNOSIS — N898 Other specified noninflammatory disorders of vagina: Secondary | ICD-10-CM | POA: Diagnosis not present

## 2020-04-26 DIAGNOSIS — M1990 Unspecified osteoarthritis, unspecified site: Secondary | ICD-10-CM | POA: Diagnosis not present

## 2020-04-26 DIAGNOSIS — M810 Age-related osteoporosis without current pathological fracture: Secondary | ICD-10-CM | POA: Diagnosis not present

## 2020-04-26 DIAGNOSIS — Z23 Encounter for immunization: Secondary | ICD-10-CM | POA: Diagnosis not present

## 2020-11-03 DIAGNOSIS — Z961 Presence of intraocular lens: Secondary | ICD-10-CM | POA: Diagnosis not present

## 2020-11-03 DIAGNOSIS — H353132 Nonexudative age-related macular degeneration, bilateral, intermediate dry stage: Secondary | ICD-10-CM | POA: Diagnosis not present

## 2020-12-17 DIAGNOSIS — H01131 Eczematous dermatitis of right upper eyelid: Secondary | ICD-10-CM | POA: Diagnosis not present

## 2021-01-13 DIAGNOSIS — F439 Reaction to severe stress, unspecified: Secondary | ICD-10-CM | POA: Diagnosis not present

## 2021-01-13 DIAGNOSIS — D649 Anemia, unspecified: Secondary | ICD-10-CM | POA: Diagnosis not present

## 2021-01-13 DIAGNOSIS — M419 Scoliosis, unspecified: Secondary | ICD-10-CM | POA: Diagnosis not present

## 2021-01-13 DIAGNOSIS — M1990 Unspecified osteoarthritis, unspecified site: Secondary | ICD-10-CM | POA: Diagnosis not present

## 2021-01-13 DIAGNOSIS — M810 Age-related osteoporosis without current pathological fracture: Secondary | ICD-10-CM | POA: Diagnosis not present

## 2021-01-13 DIAGNOSIS — Z91013 Allergy to seafood: Secondary | ICD-10-CM | POA: Diagnosis not present

## 2021-01-13 DIAGNOSIS — N898 Other specified noninflammatory disorders of vagina: Secondary | ICD-10-CM | POA: Diagnosis not present

## 2021-01-13 DIAGNOSIS — R351 Nocturia: Secondary | ICD-10-CM | POA: Diagnosis not present

## 2021-03-05 DIAGNOSIS — Z23 Encounter for immunization: Secondary | ICD-10-CM | POA: Diagnosis not present

## 2021-03-10 DIAGNOSIS — Z6822 Body mass index (BMI) 22.0-22.9, adult: Secondary | ICD-10-CM | POA: Diagnosis not present

## 2021-03-10 DIAGNOSIS — Z01419 Encounter for gynecological examination (general) (routine) without abnormal findings: Secondary | ICD-10-CM | POA: Diagnosis not present

## 2021-03-10 DIAGNOSIS — Z1231 Encounter for screening mammogram for malignant neoplasm of breast: Secondary | ICD-10-CM | POA: Diagnosis not present

## 2021-03-22 DIAGNOSIS — Z1211 Encounter for screening for malignant neoplasm of colon: Secondary | ICD-10-CM | POA: Diagnosis not present

## 2021-04-25 DIAGNOSIS — R0981 Nasal congestion: Secondary | ICD-10-CM | POA: Diagnosis not present

## 2021-04-25 DIAGNOSIS — U071 COVID-19: Secondary | ICD-10-CM | POA: Diagnosis not present

## 2021-04-25 DIAGNOSIS — R6883 Chills (without fever): Secondary | ICD-10-CM | POA: Diagnosis not present

## 2021-04-25 DIAGNOSIS — Z1152 Encounter for screening for COVID-19: Secondary | ICD-10-CM | POA: Diagnosis not present

## 2021-04-25 DIAGNOSIS — R051 Acute cough: Secondary | ICD-10-CM | POA: Diagnosis not present

## 2021-04-25 DIAGNOSIS — R5383 Other fatigue: Secondary | ICD-10-CM | POA: Diagnosis not present

## 2021-05-11 DIAGNOSIS — Z961 Presence of intraocular lens: Secondary | ICD-10-CM | POA: Diagnosis not present

## 2021-05-11 DIAGNOSIS — H353132 Nonexudative age-related macular degeneration, bilateral, intermediate dry stage: Secondary | ICD-10-CM | POA: Diagnosis not present

## 2021-05-16 DIAGNOSIS — M810 Age-related osteoporosis without current pathological fracture: Secondary | ICD-10-CM | POA: Diagnosis not present

## 2021-05-16 DIAGNOSIS — R002 Palpitations: Secondary | ICD-10-CM | POA: Diagnosis not present

## 2021-05-16 DIAGNOSIS — R7989 Other specified abnormal findings of blood chemistry: Secondary | ICD-10-CM | POA: Diagnosis not present

## 2021-05-16 DIAGNOSIS — D649 Anemia, unspecified: Secondary | ICD-10-CM | POA: Diagnosis not present

## 2021-05-16 DIAGNOSIS — Z79899 Other long term (current) drug therapy: Secondary | ICD-10-CM | POA: Diagnosis not present

## 2021-05-23 DIAGNOSIS — F439 Reaction to severe stress, unspecified: Secondary | ICD-10-CM | POA: Diagnosis not present

## 2021-05-23 DIAGNOSIS — Z0000 Encounter for general adult medical examination without abnormal findings: Secondary | ICD-10-CM | POA: Diagnosis not present

## 2021-05-23 DIAGNOSIS — Z1212 Encounter for screening for malignant neoplasm of rectum: Secondary | ICD-10-CM | POA: Diagnosis not present

## 2021-05-23 DIAGNOSIS — M419 Scoliosis, unspecified: Secondary | ICD-10-CM | POA: Diagnosis not present

## 2021-05-23 DIAGNOSIS — D649 Anemia, unspecified: Secondary | ICD-10-CM | POA: Diagnosis not present

## 2021-05-23 DIAGNOSIS — R002 Palpitations: Secondary | ICD-10-CM | POA: Diagnosis not present

## 2021-05-23 DIAGNOSIS — R351 Nocturia: Secondary | ICD-10-CM | POA: Diagnosis not present

## 2021-05-23 DIAGNOSIS — Z1331 Encounter for screening for depression: Secondary | ICD-10-CM | POA: Diagnosis not present

## 2021-05-23 DIAGNOSIS — M545 Low back pain, unspecified: Secondary | ICD-10-CM | POA: Diagnosis not present

## 2021-05-23 DIAGNOSIS — R82998 Other abnormal findings in urine: Secondary | ICD-10-CM | POA: Diagnosis not present

## 2021-05-23 DIAGNOSIS — N898 Other specified noninflammatory disorders of vagina: Secondary | ICD-10-CM | POA: Diagnosis not present

## 2021-05-23 DIAGNOSIS — M810 Age-related osteoporosis without current pathological fracture: Secondary | ICD-10-CM | POA: Diagnosis not present

## 2021-05-23 DIAGNOSIS — M1990 Unspecified osteoarthritis, unspecified site: Secondary | ICD-10-CM | POA: Diagnosis not present

## 2021-07-12 DIAGNOSIS — M810 Age-related osteoporosis without current pathological fracture: Secondary | ICD-10-CM | POA: Diagnosis not present

## 2021-12-12 DIAGNOSIS — L259 Unspecified contact dermatitis, unspecified cause: Secondary | ICD-10-CM | POA: Diagnosis not present

## 2021-12-12 DIAGNOSIS — H9201 Otalgia, right ear: Secondary | ICD-10-CM | POA: Diagnosis not present

## 2022-02-08 DIAGNOSIS — Z961 Presence of intraocular lens: Secondary | ICD-10-CM | POA: Diagnosis not present

## 2022-02-08 DIAGNOSIS — H353132 Nonexudative age-related macular degeneration, bilateral, intermediate dry stage: Secondary | ICD-10-CM | POA: Diagnosis not present

## 2022-02-18 DIAGNOSIS — Z23 Encounter for immunization: Secondary | ICD-10-CM | POA: Diagnosis not present

## 2022-05-29 DIAGNOSIS — Z79899 Other long term (current) drug therapy: Secondary | ICD-10-CM | POA: Diagnosis not present

## 2022-05-29 DIAGNOSIS — R7989 Other specified abnormal findings of blood chemistry: Secondary | ICD-10-CM | POA: Diagnosis not present

## 2022-05-29 DIAGNOSIS — R002 Palpitations: Secondary | ICD-10-CM | POA: Diagnosis not present

## 2022-05-29 DIAGNOSIS — M810 Age-related osteoporosis without current pathological fracture: Secondary | ICD-10-CM | POA: Diagnosis not present

## 2022-06-05 DIAGNOSIS — Z1331 Encounter for screening for depression: Secondary | ICD-10-CM | POA: Diagnosis not present

## 2022-06-05 DIAGNOSIS — M419 Scoliosis, unspecified: Secondary | ICD-10-CM | POA: Diagnosis not present

## 2022-06-05 DIAGNOSIS — M810 Age-related osteoporosis without current pathological fracture: Secondary | ICD-10-CM | POA: Diagnosis not present

## 2022-06-05 DIAGNOSIS — F439 Reaction to severe stress, unspecified: Secondary | ICD-10-CM | POA: Diagnosis not present

## 2022-06-05 DIAGNOSIS — R002 Palpitations: Secondary | ICD-10-CM | POA: Diagnosis not present

## 2022-06-05 DIAGNOSIS — R351 Nocturia: Secondary | ICD-10-CM | POA: Diagnosis not present

## 2022-06-05 DIAGNOSIS — Z1212 Encounter for screening for malignant neoplasm of rectum: Secondary | ICD-10-CM | POA: Diagnosis not present

## 2022-06-05 DIAGNOSIS — R82998 Other abnormal findings in urine: Secondary | ICD-10-CM | POA: Diagnosis not present

## 2022-06-05 DIAGNOSIS — Z1389 Encounter for screening for other disorder: Secondary | ICD-10-CM | POA: Diagnosis not present

## 2022-06-05 DIAGNOSIS — D649 Anemia, unspecified: Secondary | ICD-10-CM | POA: Diagnosis not present

## 2022-06-05 DIAGNOSIS — N898 Other specified noninflammatory disorders of vagina: Secondary | ICD-10-CM | POA: Diagnosis not present

## 2022-06-05 DIAGNOSIS — M1990 Unspecified osteoarthritis, unspecified site: Secondary | ICD-10-CM | POA: Diagnosis not present

## 2022-06-05 DIAGNOSIS — Z0000 Encounter for general adult medical examination without abnormal findings: Secondary | ICD-10-CM | POA: Diagnosis not present

## 2022-06-05 DIAGNOSIS — H269 Unspecified cataract: Secondary | ICD-10-CM | POA: Diagnosis not present

## 2022-10-27 DIAGNOSIS — H353132 Nonexudative age-related macular degeneration, bilateral, intermediate dry stage: Secondary | ICD-10-CM | POA: Diagnosis not present

## 2022-11-13 DIAGNOSIS — H01114 Allergic dermatitis of left upper eyelid: Secondary | ICD-10-CM | POA: Diagnosis not present

## 2022-11-13 DIAGNOSIS — H01111 Allergic dermatitis of right upper eyelid: Secondary | ICD-10-CM | POA: Diagnosis not present

## 2022-12-20 DIAGNOSIS — Z124 Encounter for screening for malignant neoplasm of cervix: Secondary | ICD-10-CM | POA: Diagnosis not present

## 2022-12-20 DIAGNOSIS — Z1151 Encounter for screening for human papillomavirus (HPV): Secondary | ICD-10-CM | POA: Diagnosis not present

## 2022-12-20 DIAGNOSIS — Z1231 Encounter for screening mammogram for malignant neoplasm of breast: Secondary | ICD-10-CM | POA: Diagnosis not present

## 2022-12-20 DIAGNOSIS — Z6823 Body mass index (BMI) 23.0-23.9, adult: Secondary | ICD-10-CM | POA: Diagnosis not present

## 2023-06-04 DIAGNOSIS — R7989 Other specified abnormal findings of blood chemistry: Secondary | ICD-10-CM | POA: Diagnosis not present

## 2023-06-04 DIAGNOSIS — D649 Anemia, unspecified: Secondary | ICD-10-CM | POA: Diagnosis not present

## 2023-06-13 DIAGNOSIS — Z1331 Encounter for screening for depression: Secondary | ICD-10-CM | POA: Diagnosis not present

## 2023-06-13 DIAGNOSIS — R351 Nocturia: Secondary | ICD-10-CM | POA: Diagnosis not present

## 2023-06-13 DIAGNOSIS — N898 Other specified noninflammatory disorders of vagina: Secondary | ICD-10-CM | POA: Diagnosis not present

## 2023-06-13 DIAGNOSIS — M810 Age-related osteoporosis without current pathological fracture: Secondary | ICD-10-CM | POA: Diagnosis not present

## 2023-06-13 DIAGNOSIS — Z23 Encounter for immunization: Secondary | ICD-10-CM | POA: Diagnosis not present

## 2023-06-13 DIAGNOSIS — Z1339 Encounter for screening examination for other mental health and behavioral disorders: Secondary | ICD-10-CM | POA: Diagnosis not present

## 2023-06-13 DIAGNOSIS — R82998 Other abnormal findings in urine: Secondary | ICD-10-CM | POA: Diagnosis not present

## 2023-06-13 DIAGNOSIS — M545 Low back pain, unspecified: Secondary | ICD-10-CM | POA: Diagnosis not present

## 2023-06-13 DIAGNOSIS — F439 Reaction to severe stress, unspecified: Secondary | ICD-10-CM | POA: Diagnosis not present

## 2023-06-13 DIAGNOSIS — Z0000 Encounter for general adult medical examination without abnormal findings: Secondary | ICD-10-CM | POA: Diagnosis not present

## 2024-02-23 DIAGNOSIS — S92355A Nondisplaced fracture of fifth metatarsal bone, left foot, initial encounter for closed fracture: Secondary | ICD-10-CM | POA: Diagnosis not present

## 2024-02-29 DIAGNOSIS — S92352D Displaced fracture of fifth metatarsal bone, left foot, subsequent encounter for fracture with routine healing: Secondary | ICD-10-CM | POA: Diagnosis not present

## 2024-03-20 DIAGNOSIS — S92352D Displaced fracture of fifth metatarsal bone, left foot, subsequent encounter for fracture with routine healing: Secondary | ICD-10-CM | POA: Diagnosis not present
